# Patient Record
Sex: Female | Born: 1950 | Hispanic: No | Marital: Married | Smoking: Never smoker
Health system: Southern US, Community
[De-identification: ages and names within clinical notes are randomized; demographics above are authoritative.]

## PROBLEM LIST (undated history)

## (undated) DIAGNOSIS — M5030 Other cervical disc degeneration, unspecified cervical region: Secondary | ICD-10-CM

## (undated) DIAGNOSIS — R569 Unspecified convulsions: Secondary | ICD-10-CM

## (undated) DIAGNOSIS — E785 Hyperlipidemia, unspecified: Secondary | ICD-10-CM

## (undated) DIAGNOSIS — E119 Type 2 diabetes mellitus without complications: Secondary | ICD-10-CM

## (undated) DIAGNOSIS — Z8711 Personal history of peptic ulcer disease: Secondary | ICD-10-CM

## (undated) DIAGNOSIS — D649 Anemia, unspecified: Secondary | ICD-10-CM

## (undated) DIAGNOSIS — R5382 Chronic fatigue, unspecified: Secondary | ICD-10-CM

## (undated) DIAGNOSIS — N12 Tubulo-interstitial nephritis, not specified as acute or chronic: Secondary | ICD-10-CM

## (undated) DIAGNOSIS — R1310 Dysphagia, unspecified: Secondary | ICD-10-CM

## (undated) DIAGNOSIS — I10 Essential (primary) hypertension: Secondary | ICD-10-CM

## (undated) DIAGNOSIS — I69359 Hemiplegia and hemiparesis following cerebral infarction affecting unspecified side: Secondary | ICD-10-CM

## (undated) DIAGNOSIS — I639 Cerebral infarction, unspecified: Secondary | ICD-10-CM

## (undated) DIAGNOSIS — K2270 Barrett's esophagus without dysplasia: Secondary | ICD-10-CM

---

## 2005-05-31 ENCOUNTER — Ambulatory Visit: Payer: Self-pay | Admitting: Unknown Physician Specialty

## 2005-06-08 ENCOUNTER — Ambulatory Visit: Payer: Self-pay | Admitting: Unknown Physician Specialty

## 2005-07-08 ENCOUNTER — Ambulatory Visit: Payer: Self-pay | Admitting: Unknown Physician Specialty

## 2005-08-08 ENCOUNTER — Ambulatory Visit: Payer: Self-pay | Admitting: Unknown Physician Specialty

## 2005-10-24 ENCOUNTER — Ambulatory Visit: Payer: Self-pay | Admitting: Internal Medicine

## 2006-01-10 ENCOUNTER — Other Ambulatory Visit: Payer: Self-pay

## 2006-01-10 ENCOUNTER — Inpatient Hospital Stay: Payer: Self-pay | Admitting: Internal Medicine

## 2009-03-01 ENCOUNTER — Ambulatory Visit: Payer: Self-pay | Admitting: Adult Health

## 2010-08-02 ENCOUNTER — Ambulatory Visit: Payer: Self-pay | Admitting: "Endocrinology

## 2010-09-01 ENCOUNTER — Ambulatory Visit: Payer: Self-pay | Admitting: Adult Health

## 2010-09-12 ENCOUNTER — Ambulatory Visit: Payer: Self-pay

## 2010-10-10 ENCOUNTER — Ambulatory Visit: Payer: Self-pay | Admitting: Gynecologic Oncology

## 2010-10-18 LAB — PATHOLOGY REPORT

## 2010-11-09 ENCOUNTER — Ambulatory Visit: Payer: Self-pay | Admitting: Gynecologic Oncology

## 2011-11-09 ENCOUNTER — Ambulatory Visit: Payer: Self-pay | Admitting: "Endocrinology

## 2011-11-27 ENCOUNTER — Ambulatory Visit: Payer: Self-pay | Admitting: Gynecologic Oncology

## 2011-12-09 ENCOUNTER — Ambulatory Visit: Payer: Self-pay | Admitting: Gynecologic Oncology

## 2011-12-18 ENCOUNTER — Ambulatory Visit: Payer: Self-pay | Admitting: Gynecologic Oncology

## 2012-01-09 ENCOUNTER — Ambulatory Visit: Payer: Self-pay | Admitting: Gynecologic Oncology

## 2012-05-01 IMAGING — MG MM DIGITAL SCREENING BILAT W/ CAD
1 series · 4 of 4 positions shown · non-contrast
Comparison: none

REASON FOR EXAM: screening
COMMENTS:

PROCEDURE:     MAM - MAM [REDACTED] DIG SCREEN MAM W/CAD  - September 12, 2010 [DATE]
RESULT:     COMPARISON:  08/21/2001, 10/24/2005
TECHNIQUE: Digital screening mammograms were obtained. FDA approved
computer-aided detection (CAD) for mammography was utilized for this study.

[Series 3769: R CC · right · 4 of 4 slices shown]
[im 1/4]
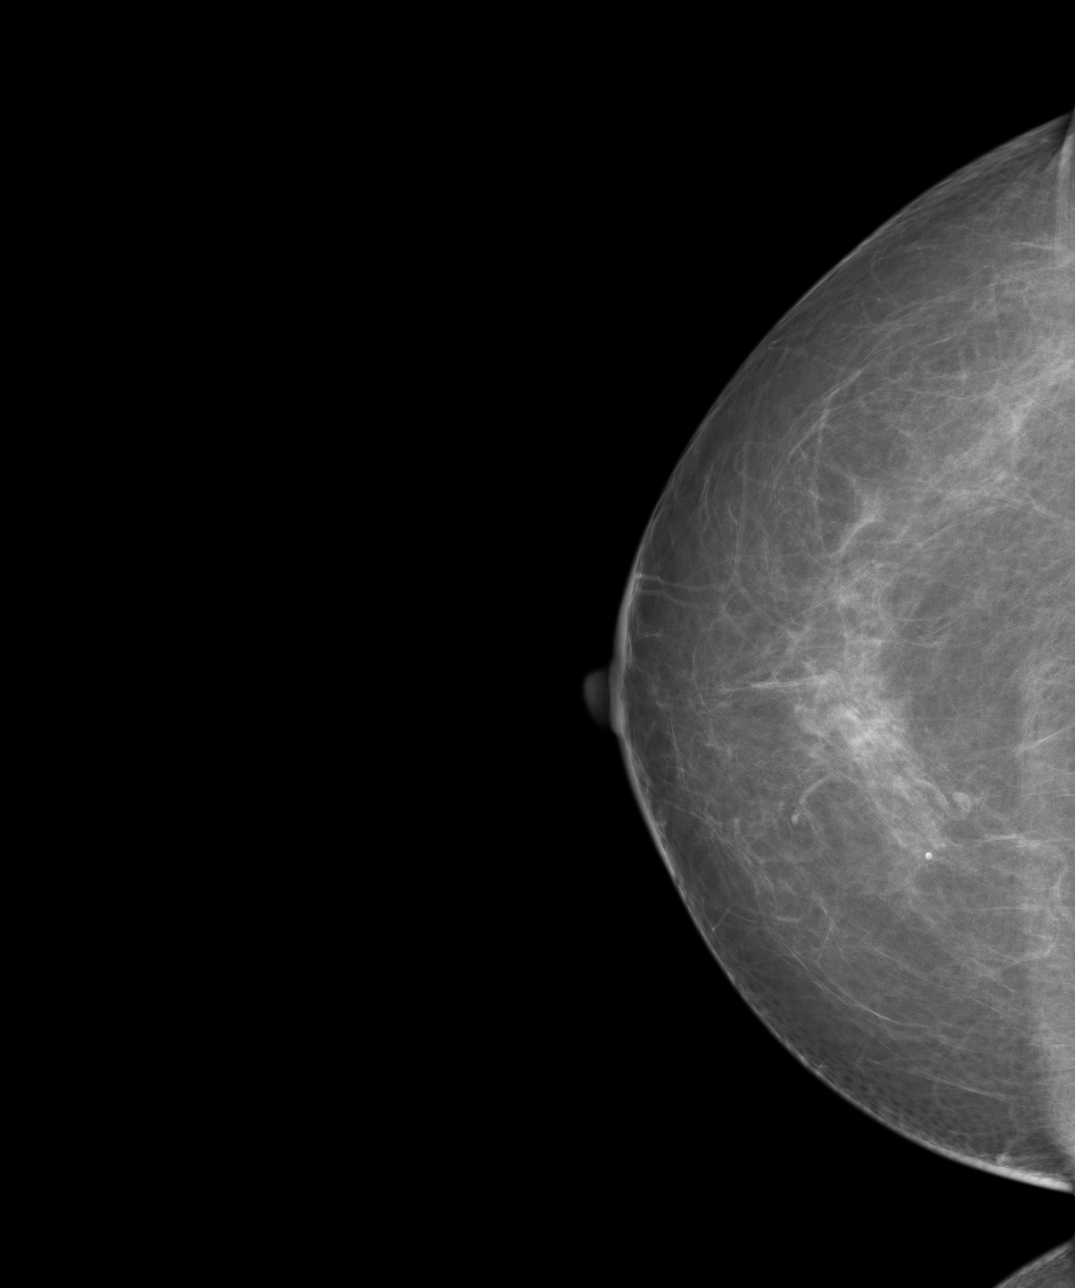
[im 2/4]
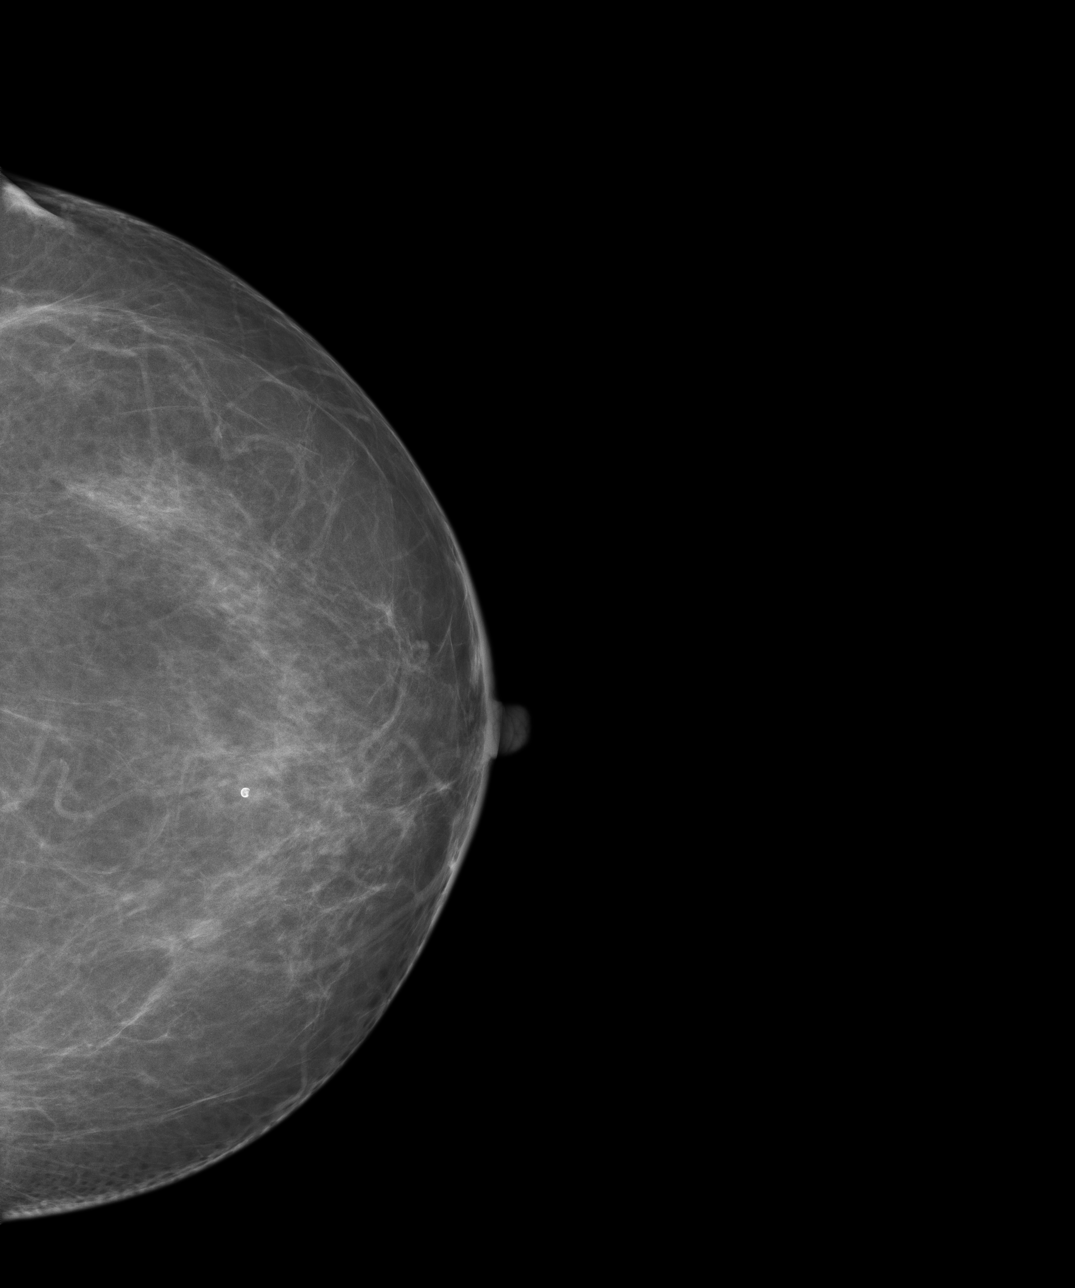
[im 3/4]
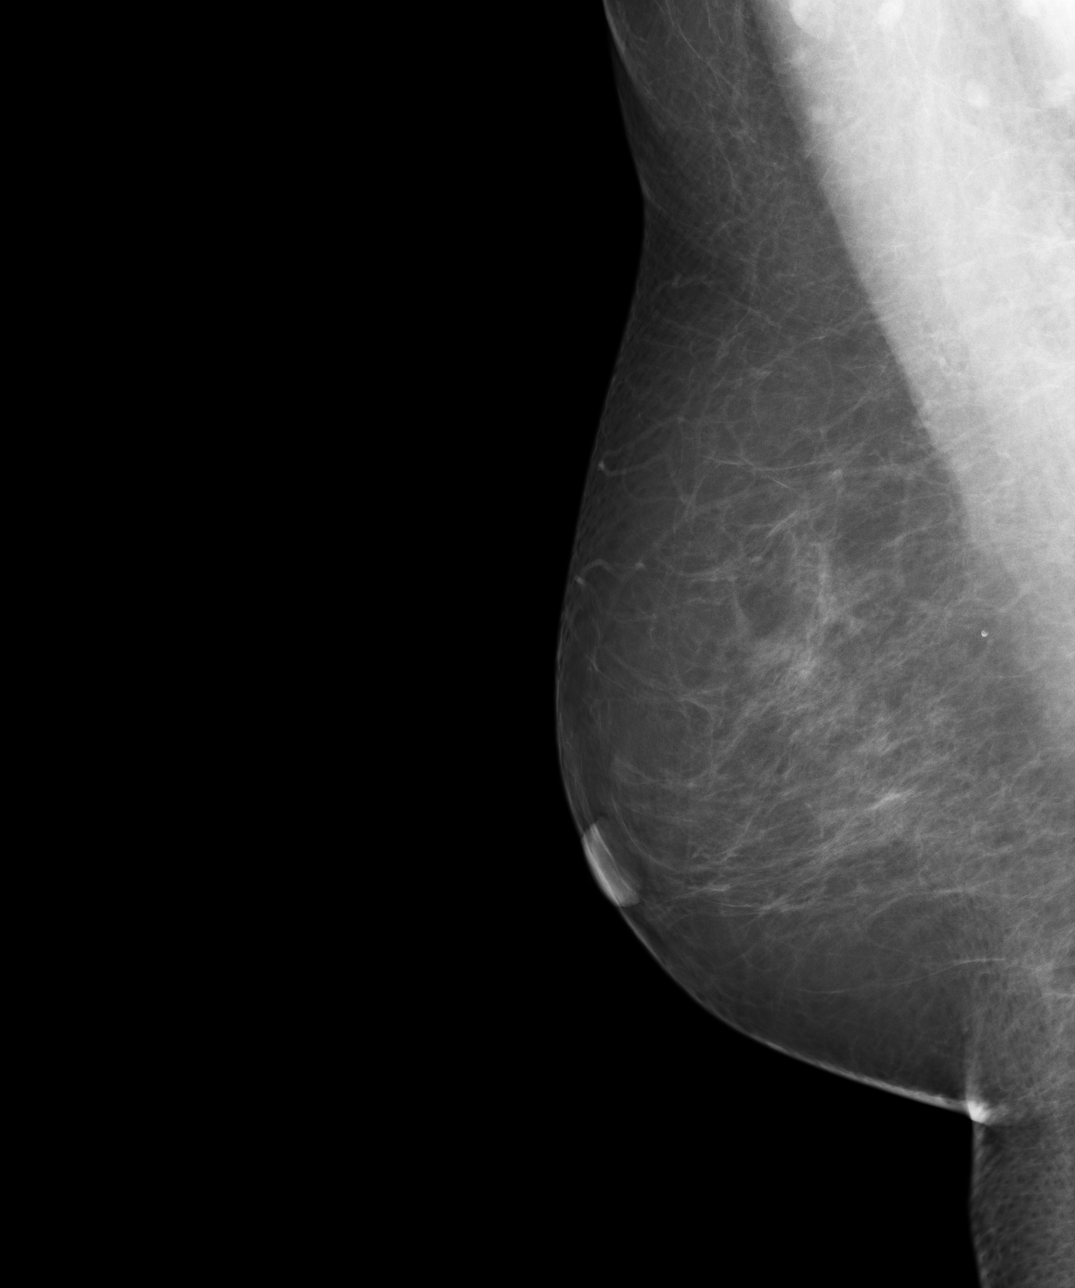
[im 4/4]
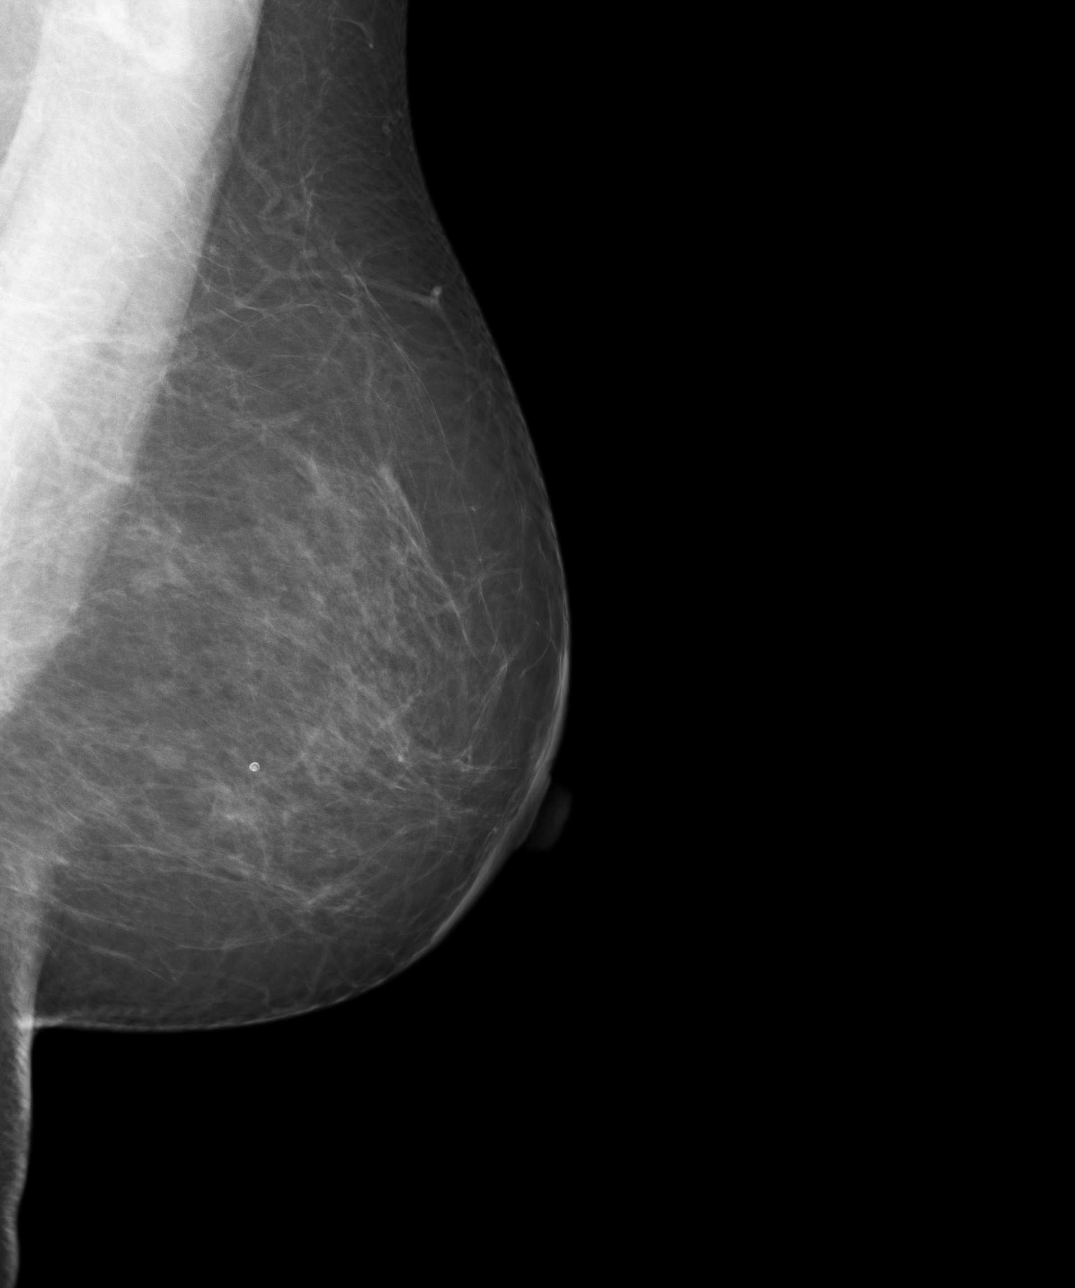

[4 of 4 positions shown; findings below may reference images not displayed]

FINDING: Bilateral breasts demonstrate a scattered fibroglandular density. There is
no dominant mass, architectural distortion or clusters of suspicious
microcalcifications.
IMPRESSION: 1.     Stable bilateral mammogram.
2.     Annual mammographic follow up recommended.
3.     BI-RADS:  Category 2- Benign.

A negative mammogram report does not preclude biopsy or other evaluation of
a clinically palpable or otherwise suspicious mass or lesion. Breast cancer
may not be detected by mammography in up to 10% of cases.

## 2012-11-12 ENCOUNTER — Ambulatory Visit: Payer: Self-pay | Admitting: Internal Medicine

## 2012-12-02 ENCOUNTER — Ambulatory Visit: Payer: Self-pay | Admitting: Gynecologic Oncology

## 2012-12-02 LAB — URINALYSIS, COMPLETE
Bacteria: NONE SEEN
Bilirubin,UR: NEGATIVE
Glucose,UR: >=500 mg/dL (ref 0–75)
Leukocyte Esterase: NEGATIVE
Nitrite: NEGATIVE
Ph: 5 (ref 4.5–8.0)
RBC,UR: 3 /HPF (ref 0–5)
Specific Gravity: 1.014 (ref 1.003–1.030)

## 2012-12-08 ENCOUNTER — Ambulatory Visit: Payer: Self-pay | Admitting: Gynecologic Oncology

## 2013-06-16 ENCOUNTER — Ambulatory Visit: Payer: Self-pay | Admitting: Gynecologic Oncology

## 2013-07-08 ENCOUNTER — Ambulatory Visit: Payer: Self-pay | Admitting: Gynecologic Oncology

## 2013-11-13 ENCOUNTER — Ambulatory Visit: Payer: Self-pay | Admitting: Internal Medicine

## 2014-03-16 ENCOUNTER — Inpatient Hospital Stay: Payer: Self-pay | Admitting: Internal Medicine

## 2014-05-09 NOTE — H&P (Signed)
PATIENT NAME:  Emily Barber, Emily Barber MR#:  [id number] DATE OF BIRTH:  09/28/1950  DATE OF ADMISSION:  03/16/2014  PRIMARY DOCTOR: Leonie Douglas. Doy Hutching, MD   EMERGENCY ROOM PHYSICIAN: Latina Craver, MD  CHIEF COMPLAINT: Elevated blood pressure.   HISTORY OF PRESENT ILLNESS: A 64 year old [place] speaking female patient brought in by family because of slurred speech started around 3:00 p.m. after she woke up from nap, associated and heaviness in both hands and legs. The patient noted to have very high blood pressure with a systolic blood pressure of 234/112. The patient brought in by family regarding the blood pressure and also symptoms concerning for stroke. The patient had no headache, no fascial droop, but noted to have slurred speech, heaviness in both hands or legs. No numbness in hands or legs. The patient lives with husband and noted to have this heaviness. According to her, this heaviness is going on for about 1 week, but it got a little more today. The patient also complains of some blurry vision today.   PAST MEDICAL HISTORY: Significant for high blood pressure and recently it has been running high, systolic in 810F as per the daughters. She also has a history of diabetes mellitus type 2, and neuropathy, and hyperlipidemia, and GERD.   ALLERGIES: BETADINE.   PAST SURGICAL HISTORY: No history of operations.  FAMILY HISTORY: The patient's sister had diabetes.   MEDICATIONS: The patient is on multiple medicines for diabetes. Her medicines include Actos 50 mg daily, Neurontin 100 mg p.o. daily, glipizide 10 mg p.o. b.i.d., Kay Ciel 10 mEq p.o. b.i.d., Lantus 60 units at bedtime, metformin 1 gram p.o. b.i.d., metoprolol succinate 50 mg p.o. daily, Protonix 40 mg daily, simvastatin 20 mg daily.   SOCIAL HISTORY: No smoking, no drinking, no drugs. Lives with husband.   REVIEW OF SYSTEMS: CONSTITUTIONAL: No fever. No fatigue.  EYES: Complains of blurred vision today. ENT: No tinnitus. No  epistaxis. No difficulty swallowing.  RESPIRATORY: No cough. No wheezing.  CARDIOVASCULAR: The patient had no chest pain, orthopnea. No PND. No palpitations.  GASTROINTESTINAL: No nausea. No vomiting. No abdominal pain.  GENITOURINARY: No dysuria or hematuria.  ENDOCRINE: No polyuria or nocturia.  HEMATOLOGIC: No anemia or easy bruising.  INTEGUMENTARY: No skin rashes. MUSCULOSKELETAL: No joint pains.  NEUROLOGIC: Complains of heaviness in both hands and legs and also some slurred speech observed today.   PSYCHIATRIC: No anxiety or insomnia.   PHYSICAL EXAMINATION:  VITAL SIGNS: Temperature is 97.9, heart rate is 80, blood pressure 234/112 initially and the patient received labetalol 10 mg IV and that brought the blood pressure down to 194/80.  GENERAL: She is a 64 year old [place] female in the ER not in distress. The patient's daughters are at bedside and translating in [place] for me.  HEENT: PERRLA. EOM intact. No scleral icterus. No conjunctivitis. Hearing is normal. Tympanic membranes clear. No pharyngeal edema. Mucous membranes normal. Dentition is good. The patient noted to have some slurred speech.  NECK: No thyroid enlargement. No JVD. No lymphadenopathy. No carotid bruit.  LUNGS: Bilaterally clear to auscultation. No wheeze. No rales.  CARDIOVASCULAR: S1, S2 regular. PMI not characterized. Good femoral and pedal pulses. No lower extremity edema.  ABDOMEN: Soft, nontender, bowel sounds present. No hepatosplenomegaly.  MUSCULOSKELETAL: Strength 5/5 in the upper and lower extremities. DTRs 2+ bilaterally. No cyanosis. No kyphosis. SKIN: No skin rashes. No erythema, no nodules. No induration. No lymphadenopathy in cervical or axillary or inguinal regions.  NEUROLOGIC: Cranial nerves II-XII intact, DTR  2+ bilaterally. Sensory intact. The patient noted to have some dysarthria.  PSYCHIATRIC: Oriented to time, place, person, cooperative.   LABORATORY DATA: Glucose 312, BUN 17,  creatinine 1.19, sodium 137, potassium 3.3. The patient's calcium 9.6.   CBC: WBC 6.7, hemoglobin 12.6, hematocrit 37.3, platelets 184,000. INR is 0.9.   The patient's CT of the head shows age-indeterminate left basal ganglia infarct.   EKG shows normal sinus rhythm, 77 beats per minute, no ST-T changes.   ASSESSMENT AND PLAN:  1.  This is a 64 year old female patient with heaviness in hands and slurred speech with malignant hypertension. Admit her to hospitalist service. Her symptoms are mostly consistent with malignant hypertension than acute stroke. she will be admitted to hospitalist service because of malignant hypertension. Continue her on home medications Diovan with hydrochlorothiazide, metoprolol. I have added hydralazine 50 mg p.o. t.i.d. in addition to her home medication list, and let us see how she does, and if needed, she can get hydralazine 20 mg IV q. 6 hours p.r.n. for systolic blood pressure more than 160/90.  2.  Heaviness in hands, legs and basal ganglia infarct. The patient's infarct is unknown whether it is acute on chronic, so I ordered MRI of the brain. Continue aspirin and also statins, and follow her neurological examination with neurological checks.  3.  Type 2 diabetes mellitus. She is on metformin, Actos and also Lantus and glipizide. Continue them and I will send her to Dr. Doy Hutching and he will see the patient tomorrow.  4.  Slurred speech and other manifestations of weakness in the hands and legs is likely secondary to malignant hypertension.   TIME SPENT: Fifty-five minutes.    ____________________________ Epifanio Lesches, MD sk:TM D: 03/16/2014 20:28:00 ET T: 03/16/2014 20:54:19 ET JOB#: [id number]  cc: Epifanio Lesches, MD, <Dictator> Epifanio Lesches MD ELECTRONICALLY SIGNED 03/26/2014 13:52

## 2014-05-09 NOTE — Discharge Summary (Signed)
PATIENT NAME:  Emily Barber, CREASMAN MR#:  [id number] DATE OF BIRTH:  1950-09-07  DATE OF ADMISSION:  03/16/2014 DATE OF DISCHARGE:  03/19/2014  REASON FOR ADMISSION: Accelerated hypertension with weakness.   HISTORY OF PRESENT ILLNESS: Please see the dictated HPI done by Dr. Vianne Bulls on 03/16/2014.   PAST MEDICAL HISTORY:  1.  Benign hypertension.  2.  Type 2 diabetes.  3.  Peripheral neuropathy.  4.  Hyperlipidemia.  5.  GE reflux disease.   MEDICATIONS ON ADMISSION: Please see admission note.   ALLERGIES: BETADINE.  SOCIAL HISTORY, FAMILY HISTORY AND REVIEW OF SYSTEMS:  As per admission note.  PHYSICAL EXAMINATION: GENERAL:  The patient was in no acute distress. VITAL SIGNS: Initially remarkable for a blood pressure of 234/112 with a heart rate of 80, temperature of 97.9.  HEENT EXAMINATION: Unremarkable.  Supple slurred speech was noted.  NECK: Supple without JVD or bruits.  LUNGS: Clear.  CARDIAC EXAMINATION: Revealed a regular rate and rhythm with normal S1, S2.  ABDOMEN: Soft and nontender.  EXTREMITIES: Were without edema.  NEUROLOGIC: Grossly nonfocal other than some dysarthria.   HOSPITAL COURSE: The patient was admitted with an acute stroke with accelerated hypertension and diabetes. She was initially placed on optimal blood pressure medications with p.r.n. IV hydralazine. She was seen by physical therapy and speech therapy. MRI of the brain did show an acute stroke. Echocardiogram and carotid Dopplers were unremarkable. Her blood pressure improved as did her blood sugars. Her dysarthria and slurred speech improved as well. She was seen by care management and clinical social work. Family deferred skilled nursing placement. They wanted to go home with home health. By 03/19/2014, the patient was stable and ready for discharge.   DISCHARGE DIAGNOSES:  1.  Acute stroke syndrome with dysarthria.  2.  Accelerated hypertension.  3.  Diabetes.  4.  Gastroesophageal reflux  disease.  5.  Hyperlipidemia.   DISCHARGE MEDICATIONS:  1.  Protonix 40 mg p.o. daily.  2.  Simvastatin 20 mg p.o. at bedtime.  3.  Toprol XL 50 mg p.o. daily.  4.  Klor-Con 20 mEq p.o. daily.  5.  Glipizide 10 mg 2 tablets b.i.d.  6.  Metformin 1000 mg p.o. b.i.d.  7.  Actos 30 mg p.o. daily.  8.  Lantus 20 units subcutaneous at bedtime.  9.  Valsartan 160 mg p.o. b.i.d.  10.  Imdur 30 mg p.o. b.i.d.  11.  Aspirin 81 mg p.o. daily.  12.  Plavix 75 mg p.o. daily.  13.  Hydralazine 50 mg p.o. q.i.d.  14.  Iron sulfate 325 mg p.o. daily.  15.  Hydrochlorothiazide 25 mg p.o. daily.   FOLLOWUP PLANS AND APPOINTMENTS:  The patient was discharged on a low sodium, carbohydrate controlled diet. She was discharged with home health. She will follow up with me within 1 weeks' time, sooner if needed.    ____________________________ Leonie Douglas Doy Hutching, MD jds:sp D: 03/25/2014 07:22:44 ET T: 03/25/2014 12:08:43 ET JOB#: [id number]  cc: Leonie Douglas. Doy Hutching, MD, <Dictator> [place] Lennice Sites MD ELECTRONICALLY SIGNED 03/25/2014 13:27

## 2014-06-18 ENCOUNTER — Other Ambulatory Visit: Payer: Self-pay | Admitting: Family Medicine

## 2015-01-10 ENCOUNTER — Other Ambulatory Visit: Payer: Self-pay | Admitting: Family Medicine

## 2015-01-25 ENCOUNTER — Other Ambulatory Visit: Payer: Self-pay | Admitting: Internal Medicine

## 2015-01-25 DIAGNOSIS — Z1231 Encounter for screening mammogram for malignant neoplasm of breast: Secondary | ICD-10-CM

## 2015-02-02 ENCOUNTER — Ambulatory Visit
Admission: RE | Admit: 2015-02-02 | Discharge: 2015-02-02 | Disposition: A | Discharge status: Home / Self Care | Payer: Medicare Other | Source: Ambulatory Visit | Attending: Internal Medicine | Admitting: Internal Medicine

## 2015-02-02 ENCOUNTER — Other Ambulatory Visit: Payer: Self-pay | Admitting: Internal Medicine

## 2015-02-02 DIAGNOSIS — Z1231 Encounter for screening mammogram for malignant neoplasm of breast: Secondary | ICD-10-CM

## 2015-06-09 ENCOUNTER — Other Ambulatory Visit: Payer: Self-pay | Admitting: Internal Medicine

## 2015-06-09 DIAGNOSIS — R1310 Dysphagia, unspecified: Secondary | ICD-10-CM

## 2015-07-01 ENCOUNTER — Ambulatory Visit
Admission: RE | Admit: 2015-07-01 | Discharge: 2015-07-01 | Disposition: A | Discharge status: Home / Self Care | Payer: Medicare Other | Source: Ambulatory Visit | Attending: Internal Medicine | Admitting: Internal Medicine

## 2015-07-01 DIAGNOSIS — R1310 Dysphagia, unspecified: Secondary | ICD-10-CM | POA: Insufficient documentation

## 2015-07-01 NOTE — Therapy (Signed)
[place], [place], [address] Phone: [phone]   Fax:     Modified Barium Swallow  Patient Details  Name: Emily Barber MRN: [id number] Date of Birth: 07/09/1950 No Data Recorded  Encounter Date: 07/01/2015      End of Session - 07/01/15 1335    Visit Number 1   Number of Visits 1   Date for SLP Re-Evaluation 07/01/15   SLP Start Time 63   SLP Stop Time  1335   SLP Time Calculation (min) 35 min   Activity Tolerance Patient tolerated treatment well      No past medical history on file.  No past surgical history on file.  There were no vitals filed for this visit.   Subjective: Patient behavior: (alertness, ability to follow instructions, etc.): Patient is [place], interpreter present for most of study- lost connection at end.  Patient stated she understood well enough.  Chief complaint: Patient has history of stroke (03/2014) and current complaints of difficulty swallowing solid food.   Objective:  Radiological Procedure: A videoflouroscopic evaluation of oral-preparatory, reflex initiation, and pharyngeal phases of the swallow was performed; as well as a screening of the upper esophageal phase.  I. POSTURE: Upright in MBS chair  II. VIEW: Lateral  III. COMPENSATORY STRATEGIES: N/A  IV. BOLUSES ADMINISTERED:   Thin Liquid: 1 large gulp   Nectar-thick Liquid: 1 large gulp    Puree: 2 teaspoon presentations   Mechanical Soft: 1/4 graham cracker in applesauce  V. RESULTS OF EVALUATION: A. ORAL PREPARATORY PHASE: (The lips, tongue, and velum are observed for strength and coordination)       **Overall Severity Rating: Within normal limits  B. SWALLOW INITIATION/REFLEX: (The reflex is normal if "triggered" by the time the bolus reached the base of the tongue)  **Overall Severity Rating: Within normal limits  C. PHARYNGEAL PHASE: (Pharyngeal function is normal if the  bolus shows rapid, smooth, and continuous transit through the pharynx and there is no pharyngeal residue after the swallow)  **Overall Severity Rating: Within normal limits  D. LARYNGEAL PENETRATION: (Material entering into the laryngeal inlet/vestibule but not aspirated)  X1 transient/flash penetration  E. ASPIRATION: None  F. ESOPHAGEAL PHASE: (Screening of the upper esophagus): limited view, but one view down to mid-esophagus showed retained barium to level of lower part of mid-esophagus  ASSESSMENT: 65 year old woman, with difficulty swallowing solid foods, is presenting with functional oropharyngeal swallowing.  Oral control of the bolus including oral hold, rotary mastication, and anterior to posterior transfer are within normal limits. Timing of the pharyngeal swallow is within normal limits.   Pharyngeal stage of swallowing including tongue base retraction, hyolaryngeal excursion, epiglottic inversion, duration/amplitude of UES opening, and laryngeal vestibule closure at the height of the swallow are within normal limits.  There was 1 episodes of transient laryngeal penetration without laryngeal vestibule residue.  There was no observed pharyngeal residue or aspiration.  The patient would benefit from referral to GI to assess esophageal function.  PLAN/RECOMMENDATIONS:   A. Diet: Regular as tolerate   B. Swallowing Precautions: N/A   C. Recommended consultation to: GI   D. Therapy recommendations N/A   E. Results and recommendations were discussed with the patient (with and without interpreter) immediately following the study and the final report routed to the referring MD.     Dysphagia - Plan: DG OP Swallowing Func-Medicare/Speech Path, DG OP Swallowing Func-Medicare/Speech Path      G-Codes -  07/01/15 1335    Functional Assessment Tool Used MBS, clinical judgment   Functional Limitations Swallowing   Swallow Current Status [id number]) At least 1 percent but less than 20 percent  impaired, limited or restricted   Swallow Goal Status [id number]) At least 1 percent but less than 20 percent impaired, limited or restricted   Swallow Discharge Status [phone]) At least 1 percent but less than 20 percent impaired, limited or restricted          Problem List There are no active problems to display for this patient.  Leroy Sea, MS/CCC- SLP  Lou Miner 07/01/2015, 1:36 PM  [facility] [place], [place], [address] Phone: [phone]   Fax:     Name: Emily Barber MRN: [id number] Date of Birth: 1950/08/07

## 2015-09-06 ENCOUNTER — Encounter: Payer: Self-pay | Admitting: *Deleted

## 2015-09-07 ENCOUNTER — Ambulatory Visit: Payer: Medicare Other | Admitting: Anesthesiology

## 2015-09-07 ENCOUNTER — Ambulatory Visit
Admission: RE | Admit: 2015-09-07 | Discharge: 2015-09-07 | Disposition: A | Discharge status: Home / Self Care | Payer: Medicare Other | Source: Ambulatory Visit | Attending: Unknown Physician Specialty | Admitting: Unknown Physician Specialty

## 2015-09-07 ENCOUNTER — Encounter
Admission: RE | Disposition: A | Discharge status: Home / Self Care | Payer: Self-pay | Source: Ambulatory Visit | Attending: Unknown Physician Specialty

## 2015-09-07 ENCOUNTER — Encounter: Payer: Self-pay | Admitting: *Deleted

## 2015-09-07 DIAGNOSIS — Z7982 Long term (current) use of aspirin: Secondary | ICD-10-CM | POA: Insufficient documentation

## 2015-09-07 DIAGNOSIS — Z79899 Other long term (current) drug therapy: Secondary | ICD-10-CM | POA: Diagnosis not present

## 2015-09-07 DIAGNOSIS — Z7902 Long term (current) use of antithrombotics/antiplatelets: Secondary | ICD-10-CM | POA: Diagnosis not present

## 2015-09-07 DIAGNOSIS — Z8673 Personal history of transient ischemic attack (TIA), and cerebral infarction without residual deficits: Secondary | ICD-10-CM | POA: Insufficient documentation

## 2015-09-07 DIAGNOSIS — R1310 Dysphagia, unspecified: Secondary | ICD-10-CM | POA: Insufficient documentation

## 2015-09-07 DIAGNOSIS — K2970 Gastritis, unspecified, without bleeding: Secondary | ICD-10-CM | POA: Insufficient documentation

## 2015-09-07 DIAGNOSIS — E119 Type 2 diabetes mellitus without complications: Secondary | ICD-10-CM | POA: Diagnosis not present

## 2015-09-07 DIAGNOSIS — Z7984 Long term (current) use of oral hypoglycemic drugs: Secondary | ICD-10-CM | POA: Diagnosis not present

## 2015-09-07 DIAGNOSIS — I10 Essential (primary) hypertension: Secondary | ICD-10-CM | POA: Diagnosis not present

## 2015-09-07 DIAGNOSIS — K319 Disease of stomach and duodenum, unspecified: Secondary | ICD-10-CM | POA: Insufficient documentation

## 2015-09-07 DIAGNOSIS — K210 Gastro-esophageal reflux disease with esophagitis: Secondary | ICD-10-CM | POA: Diagnosis not present

## 2015-09-07 DIAGNOSIS — E785 Hyperlipidemia, unspecified: Secondary | ICD-10-CM | POA: Insufficient documentation

## 2015-09-07 HISTORY — DX: Anemia, unspecified: D64.9

## 2015-09-07 HISTORY — DX: Unspecified convulsions: R56.9

## 2015-09-07 HISTORY — PX: ESOPHAGOGASTRODUODENOSCOPY (EGD) WITH PROPOFOL: SHX5813

## 2015-09-07 HISTORY — DX: Hyperlipidemia, unspecified: E78.5

## 2015-09-07 HISTORY — DX: Other cervical disc degeneration, unspecified cervical region: M50.30

## 2015-09-07 HISTORY — DX: Chronic fatigue, unspecified: R53.82

## 2015-09-07 HISTORY — DX: Essential (primary) hypertension: I10

## 2015-09-07 HISTORY — DX: Type 2 diabetes mellitus without complications: E11.9

## 2015-09-07 HISTORY — DX: Tubulo-interstitial nephritis, not specified as acute or chronic: N12

## 2015-09-07 HISTORY — DX: Dysphagia, unspecified: R13.10

## 2015-09-07 HISTORY — DX: Cerebral infarction, unspecified: I63.9

## 2015-09-07 LAB — GLUCOSE, CAPILLARY: GLUCOSE-CAPILLARY: 86 mg/dL (ref 65–99)

## 2015-09-07 SURGERY — ESOPHAGOGASTRODUODENOSCOPY (EGD) WITH PROPOFOL
Anesthesia: General

## 2015-09-07 MED ORDER — PROPOFOL 10 MG/ML IV BOLUS
INTRAVENOUS | Status: DC | PRN
  Administered 2015-09-07: 20 mg via INTRAVENOUS

## 2015-09-07 MED ORDER — SODIUM CHLORIDE 0.9 % IV SOLN
INTRAVENOUS | Status: DC
  Administered 2015-09-07: 08:00:00 via INTRAVENOUS

## 2015-09-07 MED ORDER — GLYCOPYRROLATE 0.2 MG/ML IJ SOLN
INTRAMUSCULAR | Status: DC | PRN
  Administered 2015-09-07: 0.2 mg via INTRAVENOUS

## 2015-09-07 MED ORDER — PROPOFOL 500 MG/50ML IV EMUL
INTRAVENOUS | Status: DC | PRN
  Administered 2015-09-07: 120 ug/kg/min via INTRAVENOUS

## 2015-09-07 MED ORDER — MIDAZOLAM HCL 2 MG/2ML IJ SOLN
INTRAMUSCULAR | Status: DC | PRN
Start: 2015-09-07 — End: 2015-09-07
  Administered 2015-09-07: 1 mg via INTRAVENOUS

## 2015-09-07 MED ORDER — LIDOCAINE HCL (CARDIAC) 20 MG/ML IV SOLN
INTRAVENOUS | Status: DC | PRN
  Administered 2015-09-07: 30 mg via INTRAVENOUS

## 2015-09-07 MED ORDER — SODIUM CHLORIDE 0.9 % IV SOLN: INTRAVENOUS | Status: DC

## 2015-09-07 NOTE — Anesthesia Postprocedure Evaluation (Signed)
Anesthesia Post Note  Patient: Emily Barber  Procedure(s) Performed: Procedure(s) (LRB): ESOPHAGOGASTRODUODENOSCOPY (EGD) WITH PROPOFOL (N/A)  Patient location during evaluation: Endoscopy Anesthesia Type: General Level of consciousness: awake and alert Pain management: pain level controlled Vital Signs Assessment: post-procedure vital signs reviewed and stable Respiratory status: spontaneous breathing and respiratory function stable Cardiovascular status: stable Anesthetic complications: no    Last Vitals:  Vitals:   09/07/15 0848 09/07/15 0858  BP: 126/71 (!) 144/76  Pulse: 83 85  Resp: (!) 21 (!) 23  Temp: 36.3 C     Last Pain:  Vitals:   09/07/15 0848  TempSrc: Tympanic                 [name]

## 2015-09-07 NOTE — Op Note (Signed)
[facility] Patient Name: Emily Barber Procedure Date: 09/07/2015 8:28 AM MRN: [id number] Account #: [id number] Date of Birth: [birth year] Admit Type: Outpatient Age: 65 Room: [facility] ENDO ROOM 4 Gender: Female Note Status: Finalized Procedure:            Upper GI endoscopy Indications:          Dysphagia, Heartburn Providers:            Manya Silvas, MD Referring MD:         Leonie Douglas. Doy Hutching, MD (Referring MD) Medicines:            Propofol per Anesthesia Complications:        No immediate complications. Procedure:            Pre-Anesthesia Assessment:                       - After reviewing the risks and benefits, the patient                        was deemed in satisfactory condition to undergo the                        procedure.                       After obtaining informed consent, the endoscope was                        passed under direct vision. Throughout the procedure,                        the patient's blood pressure, pulse, and oxygen                        saturations were monitored continuously. The                        Colonoscope was introduced through the mouth, and                        advanced to the second part of duodenum. The upper GI                        endoscopy was accomplished without difficulty. The                        patient tolerated the procedure well. Findings:      LA Grade A (one or more mucosal breaks less than 5 mm, not extending       between tops of 2 mucosal folds) esophagitis with no bleeding was found       39 cm from the incisors. Biopsies were taken with a cold forceps for       histology.      Patchy mild inflammation characterized by erythema and granularity was       found in the gastric body and in the gastric antrum. Biopsies were taken       with a cold forceps for histology. Biopsies were taken with a cold       forceps for Helicobacter pylori testing.      The examined  duodenum was normal. Impression:           -  LA Grade A reflux esophagitis. Rule out Barrett's                        esophagus. Biopsied.                       - Gastritis. Biopsied.                       - Normal examined duodenum. Recommendation:       - Await pathology results. Stay on medicine for your                        stomach forever. Manya Silvas, MD 09/07/2015 8:47:00 AM This report has been signed electronically. Number of Addenda: 0 Note Initiated On: 09/07/2015 8:28 AM      [facility]

## 2015-09-07 NOTE — H&P (Signed)
Primary Care Physician:  Idelle Crouch, MD Primary Gastroenterologist:  Dr. Vira Agar  Pre-Procedure History & Physical: HPI:  Emily Barber is a 65 y.o. female is here for an endoscopy.  For dysphagia and GERD   Past Medical History:  Diagnosis Date  . Anemia   . Chronic fatigue   . DDD (degenerative disc disease), cervical   . Diabetes mellitus without complication ([place])   . Dysphagia   . Hyperlipidemia   . Hypertension   . Pyelonephritis   . Seizures ([place])   . Stroke [facility])     Past Surgical History:  Procedure Laterality Date  . CESAREAN SECTION      Prior to Admission medications   Medication Sig Start Date End Date Taking? Authorizing Provider  aspirin EC 81 MG tablet Take 81 mg by mouth daily.   Yes Historical Provider, MD  clopidogrel (PLAVIX) 75 MG tablet Take 75 mg by mouth daily.   Yes Historical Provider, MD  ferrous sulfate 325 (65 FE) MG tablet Take 325 mg by mouth daily with breakfast.   Yes Historical Provider, MD  glipiZIDE (GLUCOTROL) 10 MG tablet Take 10 mg by mouth daily before breakfast.   Yes Historical Provider, MD  hydrochlorothiazide (HYDRODIURIL) 25 MG tablet Take 25 mg by mouth daily.   Yes Historical Provider, MD  Insulin Degludec (TRESIBA FLEXTOUCH) 200 UNIT/ML SOPN Inject 50 Units into the skin daily.   Yes Historical Provider, MD  isosorbide mononitrate (IMDUR) 30 MG 24 hr tablet Take 30 mg by mouth 2 (two) times daily.   Yes Historical Provider, MD  metFORMIN (GLUCOPHAGE) 1000 MG tablet Take 1,000 mg by mouth 2 (two) times daily with a meal.   Yes Historical Provider, MD  metoprolol succinate (TOPROL-XL) 100 MG 24 hr tablet Take 100 mg by mouth daily. Take with or immediately following a meal.   Yes Historical Provider, MD  MULTIPLE VITAMIN PO Take 1 tablet by mouth daily.   Yes Historical Provider, MD  pioglitazone (ACTOS) 15 MG tablet Take 15 mg by mouth daily.   Yes Historical Provider, MD  potassium chloride SA (K-DUR,KLOR-CON) 20  MEQ tablet Take 20 mEq by mouth daily.   Yes Historical Provider, MD  simvastatin (ZOCOR) 20 MG tablet Take 20 mg by mouth daily.   Yes Historical Provider, MD  valsartan (DIOVAN) 160 MG tablet Take 160 mg by mouth daily.   Yes Historical Provider, MD  ESTRACE VAGINAL 0.1 MG/GM vaginal cream APPLY ONE APPLICATORFUL VAGINALLY THREE TIMES A WEEK Patient not taking: Reported on 09/07/2015 01/11/15   Evlyn Kanner, NP    Allergies as of 09/05/2015  . (Not on File)    History reviewed. No pertinent family history.  Social History   Social History  . Marital status: Married    Spouse name: N/A  . Number of children: N/A  . Years of education: N/A   Occupational History  . Not on file.   Social History Main Topics  . Smoking status: Never Smoker  . Smokeless tobacco: Never Used  . Alcohol use No  . Drug use: No  . Sexual activity: Not on file   Other Topics Concern  . Not on file   Social History Narrative  . No narrative on file    Review of Systems: See HPI, otherwise negative ROS  Physical Exam: BP (!) 141/76   Pulse 87   Temp 97.3 F (36.3 C) (Tympanic)   Resp 15   Ht 5\' 4"  (1.626 m)  Wt 71.2 kg (157 lb)   SpO2 98%   BMI 26.95 kg/m  General:   Alert,  pleasant and cooperative in NAD Head:  Normocephalic and atraumatic. Neck:  Supple; no masses or thyromegaly. Lungs:  Clear throughout to auscultation.    Heart:  Regular rate and rhythm. Abdomen:  Soft, nontender and nondistended. Normal bowel sounds, without guarding, and without rebound.   Neurologic:  Alert and  oriented x4;  grossly normal neurologically.  Impression/Plan: Elizabella S Virrueta is here for an endoscopy to be performed for dysphagia and GERD  Risks, benefits, limitations, and alternatives regarding  endoscopy have been reviewed with the patient.  Questions have been answered.  All parties agreeable.   Gaylyn Cheers, MD  09/07/2015, 10:32 AM

## 2015-09-07 NOTE — Anesthesia Preprocedure Evaluation (Signed)
Anesthesia Evaluation  Patient identified by MRN, date of birth, ID band Patient awake    Reviewed: Allergy & Precautions, NPO status , Patient's Chart, lab work & pertinent test results  History of Anesthesia Complications Negative for: history of anesthetic complications  Airway Mallampati: II       Dental   Pulmonary neg pulmonary ROS,           Cardiovascular hypertension, Pt. on medications and Pt. on home beta blockers      Neuro/Psych Seizures - (only with hypoglycemia),  CVA (l sided weakness), Residual Symptoms    GI/Hepatic GERD  ,  Endo/Other  diabetes, Type 2, Oral Hypoglycemic Agents  Renal/GU Renal disease (Pyelonephritis)     Musculoskeletal   Abdominal   Peds  Hematology  (+) anemia ,   Anesthesia Other Findings   Reproductive/Obstetrics                            Anesthesia Physical Anesthesia Plan  ASA: III  Anesthesia Plan: General   Post-op Pain Management:    Induction: Intravenous  Airway Management Planned: Nasal Cannula  Additional Equipment:   Intra-op Plan:   Post-operative Plan:   Informed Consent: I have reviewed the patients History and Physical, chart, labs and discussed the procedure including the risks, benefits and alternatives for the proposed anesthesia with the patient or authorized representative who has indicated his/her understanding and acceptance.     Plan Discussed with:   Anesthesia Plan Comments:         Anesthesia Quick Evaluation

## 2015-09-07 NOTE — Transfer of Care (Signed)
Immediate Anesthesia Transfer of Care Note  Patient: Emily Barber  Procedure(s) Performed: Procedure(s): ESOPHAGOGASTRODUODENOSCOPY (EGD) WITH PROPOFOL (N/A)  Patient Location: PACU  Anesthesia Type:General  Level of Consciousness: sedated  Airway & Oxygen Therapy: Patient Spontanous Breathing and Patient connected to nasal cannula oxygen  Post-op Assessment: Report given to RN and Post -op Vital signs reviewed and stable  Post vital signs: Reviewed and stable  Last Vitals:  Vitals:   09/07/15 0733 09/07/15 0848  BP: (!) 155/65 126/71  Pulse: 69 83  Resp: 20 (!) 21  Temp: 36.6 C 36.3 C    Last Pain:  Vitals:   09/07/15 0848  TempSrc: Tympanic         Complications: No apparent anesthesia complications

## 2015-09-07 NOTE — Anesthesia Procedure Notes (Signed)
Date/Time: 09/07/2015 8:28 AM Performed by: Johnna Acosta Pre-anesthesia Checklist: Patient identified, Emergency Drugs available, Suction available, Patient being monitored and Timeout performed Patient Re-evaluated:Patient Re-evaluated prior to inductionOxygen Delivery Method: Nasal cannula

## 2015-09-08 ENCOUNTER — Encounter: Payer: Self-pay | Admitting: Unknown Physician Specialty

## 2015-09-09 LAB — SURGICAL PATHOLOGY

## 2015-10-05 ENCOUNTER — Ambulatory Visit: Payer: Medicare Other | Admitting: *Deleted

## 2015-10-05 ENCOUNTER — Encounter: Payer: Self-pay | Admitting: Anesthesiology

## 2015-10-05 ENCOUNTER — Ambulatory Visit
Admission: RE | Admit: 2015-10-05 | Discharge: 2015-10-05 | Disposition: A | Discharge status: Home / Self Care | Payer: Medicare Other | Source: Ambulatory Visit | Attending: Unknown Physician Specialty | Admitting: Unknown Physician Specialty

## 2015-10-05 ENCOUNTER — Encounter
Admission: RE | Disposition: A | Discharge status: Home / Self Care | Payer: Self-pay | Source: Ambulatory Visit | Attending: Unknown Physician Specialty

## 2015-10-05 DIAGNOSIS — M1990 Unspecified osteoarthritis, unspecified site: Secondary | ICD-10-CM | POA: Insufficient documentation

## 2015-10-05 DIAGNOSIS — E785 Hyperlipidemia, unspecified: Secondary | ICD-10-CM | POA: Diagnosis not present

## 2015-10-05 DIAGNOSIS — Z79899 Other long term (current) drug therapy: Secondary | ICD-10-CM | POA: Insufficient documentation

## 2015-10-05 DIAGNOSIS — K219 Gastro-esophageal reflux disease without esophagitis: Secondary | ICD-10-CM | POA: Diagnosis not present

## 2015-10-05 DIAGNOSIS — Z7982 Long term (current) use of aspirin: Secondary | ICD-10-CM | POA: Insufficient documentation

## 2015-10-05 DIAGNOSIS — K640 First degree hemorrhoids: Secondary | ICD-10-CM | POA: Diagnosis not present

## 2015-10-05 DIAGNOSIS — D122 Benign neoplasm of ascending colon: Secondary | ICD-10-CM | POA: Insufficient documentation

## 2015-10-05 DIAGNOSIS — D649 Anemia, unspecified: Secondary | ICD-10-CM | POA: Diagnosis not present

## 2015-10-05 DIAGNOSIS — Z8673 Personal history of transient ischemic attack (TIA), and cerebral infarction without residual deficits: Secondary | ICD-10-CM | POA: Diagnosis not present

## 2015-10-05 DIAGNOSIS — I10 Essential (primary) hypertension: Secondary | ICD-10-CM | POA: Diagnosis not present

## 2015-10-05 DIAGNOSIS — R195 Other fecal abnormalities: Secondary | ICD-10-CM | POA: Diagnosis not present

## 2015-10-05 DIAGNOSIS — E119 Type 2 diabetes mellitus without complications: Secondary | ICD-10-CM | POA: Insufficient documentation

## 2015-10-05 DIAGNOSIS — Z7984 Long term (current) use of oral hypoglycemic drugs: Secondary | ICD-10-CM | POA: Insufficient documentation

## 2015-10-05 DIAGNOSIS — D123 Benign neoplasm of transverse colon: Secondary | ICD-10-CM | POA: Insufficient documentation

## 2015-10-05 HISTORY — PX: COLONOSCOPY: SHX5424

## 2015-10-05 LAB — GLUCOSE, CAPILLARY: Glucose-Capillary: 241 mg/dL — ABNORMAL HIGH (ref 65–99)

## 2015-10-05 SURGERY — COLONOSCOPY
Anesthesia: General

## 2015-10-05 MED ORDER — PROPOFOL 500 MG/50ML IV EMUL
INTRAVENOUS | Status: DC | PRN
  Administered 2015-10-05: 50 ug/kg/min via INTRAVENOUS

## 2015-10-05 MED ORDER — SODIUM CHLORIDE 0.9 % IV SOLN
INTRAVENOUS | Status: DC
  Administered 2015-10-05: 07:00:00 via INTRAVENOUS

## 2015-10-05 MED ORDER — SODIUM CHLORIDE 0.9 % IV SOLN
INTRAVENOUS | Status: DC
  Administered 2015-10-05: 08:00:00 via INTRAVENOUS

## 2015-10-05 NOTE — Op Note (Signed)
[facility] Patient Name: Emily Barber Procedure Date: 10/05/2015 7:28 AM MRN: [id number] Account #: [id number] Date of Birth: 20-Aug-1950 Admit Type: Outpatient Age: 65 Room: [facility] ENDO ROOM 3 Gender: Female Note Status: Finalized Procedure:            Colonoscopy Indications:          Positive Cologuard test Providers:            Manya Silvas, MD Referring MD:         Leonie Douglas. Doy Hutching, MD (Referring MD) Medicines:            Propofol per Anesthesia Complications:        No immediate complications. Procedure:            Pre-Anesthesia Assessment:                       - After reviewing the risks and benefits, the patient                        was deemed in satisfactory condition to undergo the                        procedure.                       After obtaining informed consent, the colonoscope was                        passed under direct vision. Throughout the procedure,                        the patient's blood pressure, pulse, and oxygen                        saturations were monitored continuously. The                        Colonoscope was introduced through the anus and                        advanced to the the cecum, identified by appendiceal                        orifice and ileocecal valve. The colonoscopy was                        performed without difficulty. The patient tolerated the                        procedure well. The quality of the bowel preparation                        was excellent. Findings:      A small polyp was found in the ascending colon. The polyp was sessile.       The polyp was removed with a hot snare. Resection and retrieval were       complete. To prevent bleeding after the polypectomy, one hemostatic clip       was successfully placed. There was no bleeding at the end of the       procedure.      A  small polyp was found in the transverse colon. The polyp was sessile.       The polyp  was removed with a hot snare. Resection and retrieval were       complete.      A small polyp was found in the transverse colon. The polyp was sessile.       The polyp was removed with a hot snare. Resection and retrieval were       complete. To prevent bleeding after the polypectomy, one hemostatic clip       was successfully placed. There was no bleeding during, or at the end, of       the procedure.      . A tiny polyp was destroyed with tip of snare with heat.      Internal hemorrhoids were found during endoscopy. The hemorrhoids were       small and Grade I (internal hemorrhoids that do not prolapse). Impression:           - One small polyp in the ascending colon, removed with                        a hot snare. Resected and retrieved. Clip was placed.                       - One small polyp in the transverse colon, removed with                        a hot snare. Resected and retrieved.                       - One small polyp in the transverse colon, removed with                        a hot snare. Resected and retrieved. Clip was placed.                       - One diminutive polyp in the transverse colon.                       - Internal hemorrhoids. Recommendation:       - Await pathology results. Restart Plavix tomorrow. Manya Silvas, MD 10/05/2015 8:05:29 AM This report has been signed electronically. Number of Addenda: 0 Note Initiated On: 10/05/2015 7:28 AM Scope Withdrawal Time: 0 hours 18 minutes 39 seconds  Total Procedure Duration: 0 hours 25 minutes 20 seconds       [facility]

## 2015-10-05 NOTE — H&P (Signed)
Primary Care Physician:  Idelle Crouch, MD Primary Gastroenterologist:  Dr. Vira Agar  Pre-Procedure History & Physical: HPI:  Emily Barber is a 65 y.o. female is here for an colonoscopy.   Past Medical History:  Diagnosis Date  . Anemia   . Chronic fatigue   . DDD (degenerative disc disease), cervical   . Diabetes mellitus without complication ([place])   . Dysphagia   . Hyperlipidemia   . Hypertension   . Pyelonephritis   . Seizures ([place])   . Stroke [facility])     Past Surgical History:  Procedure Laterality Date  . CESAREAN SECTION    . ESOPHAGOGASTRODUODENOSCOPY (EGD) WITH PROPOFOL N/A 09/07/2015   Procedure: ESOPHAGOGASTRODUODENOSCOPY (EGD) WITH PROPOFOL;  Surgeon: Manya Silvas, MD;  Location: [facility] ENDOSCOPY;  Service: Endoscopy;  Laterality: N/A;    Prior to Admission medications   Medication Sig Start Date End Date Taking? Authorizing Provider  aspirin EC 81 MG tablet Take 81 mg by mouth daily.   Yes Historical Provider, MD  clopidogrel (PLAVIX) 75 MG tablet Take 75 mg by mouth daily.   Yes Historical Provider, MD  ESTRACE VAGINAL 0.1 MG/GM vaginal cream APPLY ONE APPLICATORFUL VAGINALLY THREE TIMES A WEEK 01/11/15  Yes Evlyn Kanner, NP  ferrous sulfate 325 (65 FE) MG tablet Take 325 mg by mouth daily with breakfast.   Yes Historical Provider, MD  glipiZIDE (GLUCOTROL) 10 MG tablet Take 10 mg by mouth daily before breakfast.   Yes Historical Provider, MD  hydrochlorothiazide (HYDRODIURIL) 25 MG tablet Take 25 mg by mouth daily.   Yes Historical Provider, MD  Insulin Degludec (TRESIBA FLEXTOUCH) 200 UNIT/ML SOPN Inject 50 Units into the skin daily.   Yes Historical Provider, MD  isosorbide mononitrate (IMDUR) 30 MG 24 hr tablet Take 30 mg by mouth 2 (two) times daily.   Yes Historical Provider, MD  metFORMIN (GLUCOPHAGE) 1000 MG tablet Take 1,000 mg by mouth 2 (two) times daily with a meal.   Yes Historical Provider, MD  metoprolol succinate (TOPROL-XL) 100 MG 24  hr tablet Take 100 mg by mouth daily. Take with or immediately following a meal.   Yes Historical Provider, MD  MULTIPLE VITAMIN PO Take 1 tablet by mouth daily.   Yes Historical Provider, MD  pioglitazone (ACTOS) 15 MG tablet Take 15 mg by mouth daily.   Yes Historical Provider, MD  potassium chloride SA (K-DUR,KLOR-CON) 20 MEQ tablet Take 20 mEq by mouth daily.   Yes Historical Provider, MD  simvastatin (ZOCOR) 20 MG tablet Take 20 mg by mouth daily.   Yes Historical Provider, MD  valsartan (DIOVAN) 160 MG tablet Take 160 mg by mouth daily.   Yes Historical Provider, MD    Allergies as of 09/29/2015 - Review Complete 09/07/2015  Allergen Reaction Noted  . Povidone iodine  09/06/2015    No family history on file.  Social History   Social History  . Marital status: Married    Spouse name: N/A  . Number of children: N/A  . Years of education: N/A   Occupational History  . Not on file.   Social History Main Topics  . Smoking status: Never Smoker  . Smokeless tobacco: Never Used  . Alcohol use No  . Drug use: No  . Sexual activity: Not on file   Other Topics Concern  . Not on file   Social History Narrative  . No narrative on file    Review of Systems: See HPI, otherwise negative ROS  Physical  Exam: BP (!) 163/80   Pulse 85   Temp 98.1 F (36.7 C) (Tympanic)   Resp 16   Ht 5\' 5"  (1.651 m)   Wt 76.2 kg (168 lb)   SpO2 96%   BMI 27.96 kg/m  General:   Alert,  pleasant and cooperative in NAD Head:  Normocephalic and atraumatic. Neck:  Supple; no masses or thyromegaly. Lungs:  Clear throughout to auscultation.    Heart:  Regular rate and rhythm. Abdomen:  Soft, nontender and nondistended. Normal bowel sounds, without guarding, and without rebound.   Neurologic:  Alert and  oriented x4;  grossly normal neurologically.  Impression/Plan: Emily Barber is here for an colonoscopy to be performed for exam due to a positive Cologuard test on her  stool  Risks, benefits, limitations, and alternatives regarding  colonoscopy have been reviewed with the patient.  Questions have been answered.  All parties agreeable.   Gaylyn Cheers, MD  10/05/2015, 7:21 AM

## 2015-10-05 NOTE — Anesthesia Preprocedure Evaluation (Signed)
Anesthesia Evaluation  Patient identified by MRN, date of birth, ID band Patient awake    Reviewed: Allergy & Precautions, NPO status , Patient's Chart, lab work & pertinent test results  History of Anesthesia Complications Negative for: history of anesthetic complications  Airway Mallampati: II       Dental   Pulmonary neg pulmonary ROS,           Cardiovascular hypertension, Pt. on medications and Pt. on home beta blockers      Neuro/Psych Seizures - (only with hypoglycemia),  CVA (l sided weakness), Residual Symptoms    GI/Hepatic GERD  Medicated,  Endo/Other  diabetes, Type 2, Oral Hypoglycemic Agents  Renal/GU Renal disease (Pyelonephritis)  negative genitourinary   Musculoskeletal  (+) Arthritis , Osteoarthritis,    Abdominal   Peds  Hematology  (+) anemia ,   Anesthesia Other Findings   Reproductive/Obstetrics                             Anesthesia Physical  Anesthesia Plan  ASA: III  Anesthesia Plan: General   Post-op Pain Management:    Induction: Intravenous  Airway Management Planned: Nasal Cannula  Additional Equipment:   Intra-op Plan:   Post-operative Plan:   Informed Consent: I have reviewed the patients History and Physical, chart, labs and discussed the procedure including the risks, benefits and alternatives for the proposed anesthesia with the patient or authorized representative who has indicated his/her understanding and acceptance.     Plan Discussed with:   Anesthesia Plan Comments:         Anesthesia Quick Evaluation

## 2015-10-05 NOTE — Anesthesia Postprocedure Evaluation (Signed)
Anesthesia Post Note  Patient: Emily Barber  Procedure(s) Performed: Procedure(s) (LRB): COLONOSCOPY (N/A)  Patient location during evaluation: PACU Anesthesia Type: General Level of consciousness: awake and alert and oriented Pain management: pain level controlled Vital Signs Assessment: post-procedure vital signs reviewed and stable Respiratory status: spontaneous breathing Cardiovascular status: blood pressure returned to baseline Anesthetic complications: no    Last Vitals:  Vitals:   10/05/15 0826 10/05/15 0839  BP: (!) 178/77 (!) 179/70  Pulse:    Resp:    Temp:      Last Pain:  Vitals:   10/05/15 0806  TempSrc: Tympanic                 Kyarah Enamorado

## 2015-10-05 NOTE — Transfer of Care (Signed)
Immediate Anesthesia Transfer of Care Note  Patient: Emily Barber  Procedure(s) Performed: Procedure(s): COLONOSCOPY (N/A)  Patient Location: PACU  Anesthesia Type:General  Level of Consciousness: awake, alert  and oriented  Airway & Oxygen Therapy: Patient connected to nasal cannula oxygen  Post-op Assessment: Report given to RN and Post -op Vital signs reviewed and stable  Post vital signs: Reviewed and stable  Last Vitals:  Vitals:   10/05/15 0710  BP: (!) 163/80  Pulse: 85  Resp: 16  Temp: 36.7 C    Last Pain:  Vitals:   10/05/15 0710  TempSrc: Tympanic         Complications: No apparent anesthesia complications

## 2015-10-06 ENCOUNTER — Encounter: Payer: Self-pay | Admitting: Unknown Physician Specialty

## 2015-10-06 LAB — SURGICAL PATHOLOGY

## 2015-11-03 IMAGING — CT CT HEAD WITHOUT CONTRAST
1 series · 15 of 30 positions shown, 19 images · non-contrast
Comparison: None.

CLINICAL DATA: Slurred speech, left-sided weakness since 3 p.m.
today.

EXAM:
CT HEAD WITHOUT CONTRAST
TECHNIQUE: Contiguous axial images were obtained from the base of the skull
through the vertex without intravenous contrast.

[Series 2: head wo · axial · 0.38mm/px · z∈[+10,+136]mm · 15 of 32 slices shown, 19 images]
[im 2/32  brain]
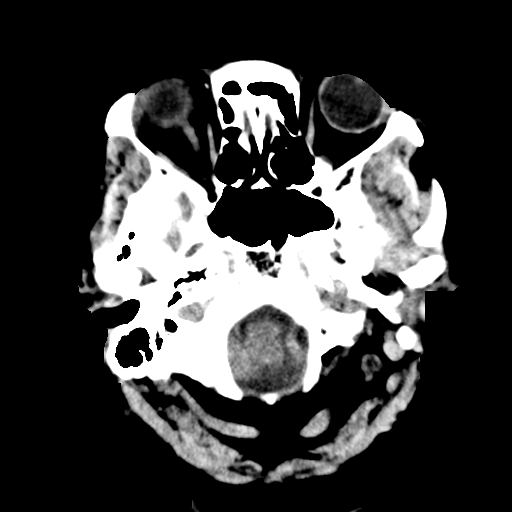
[im 2/32  bone]
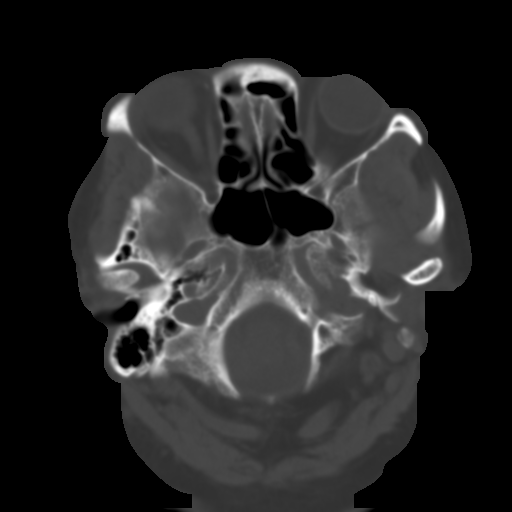
[im 4/32  brain]
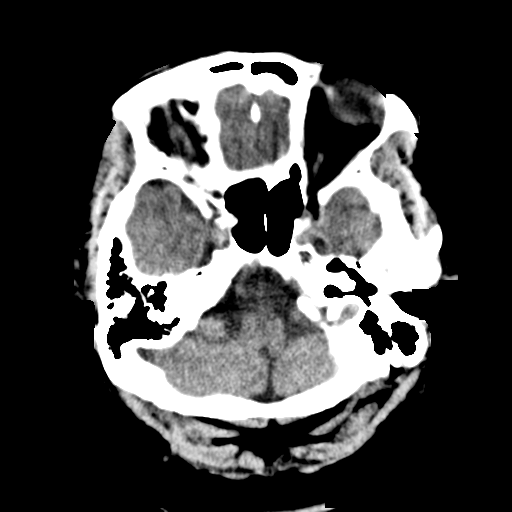
[im 6/32  brain]
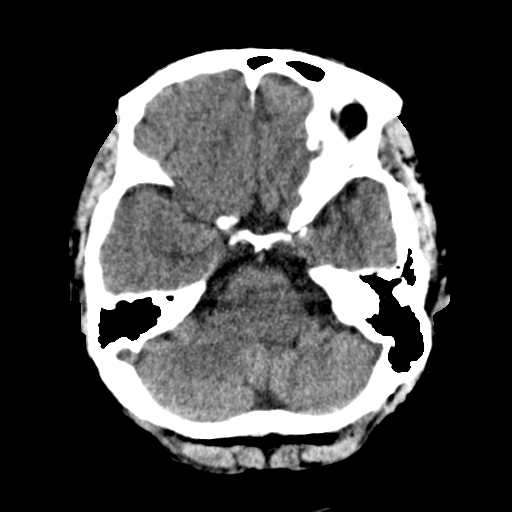
[im 8/32  brain]
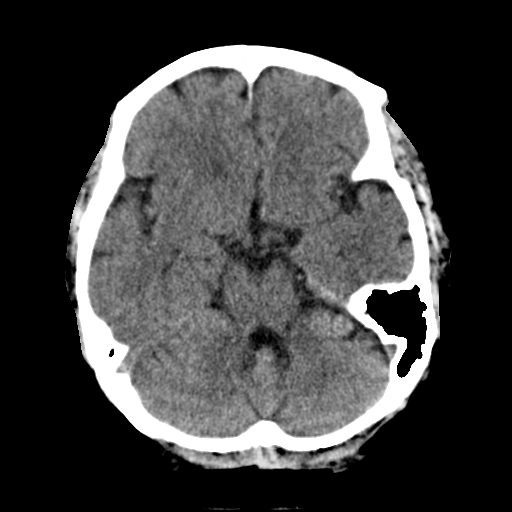
[im 10/32  brain]
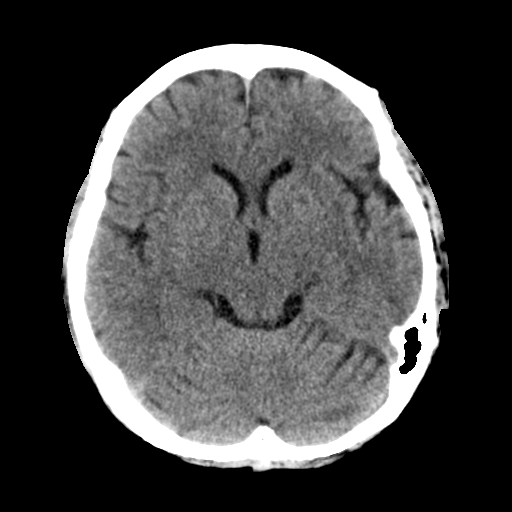
[im 10/32  bone]
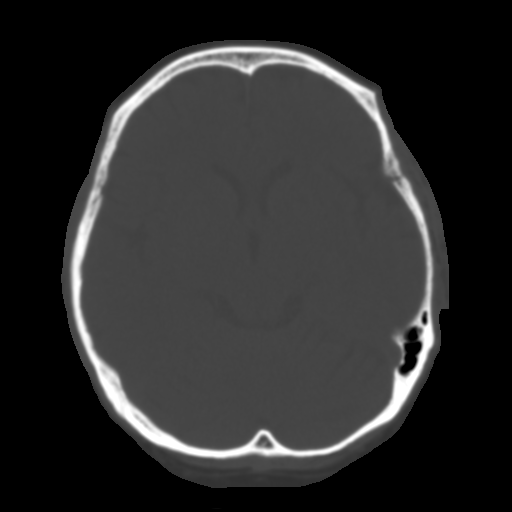
[im 12/32  brain]
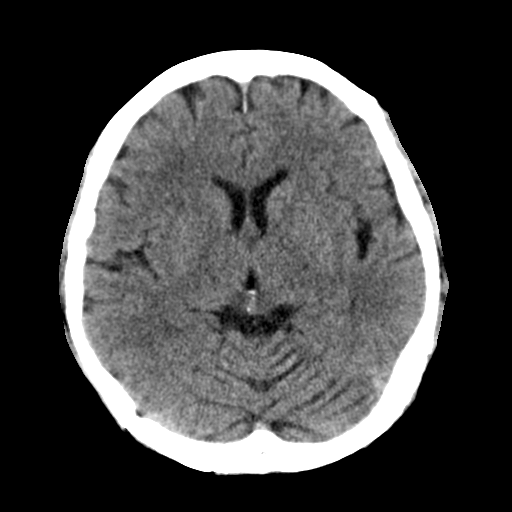
[im 14/32  brain]
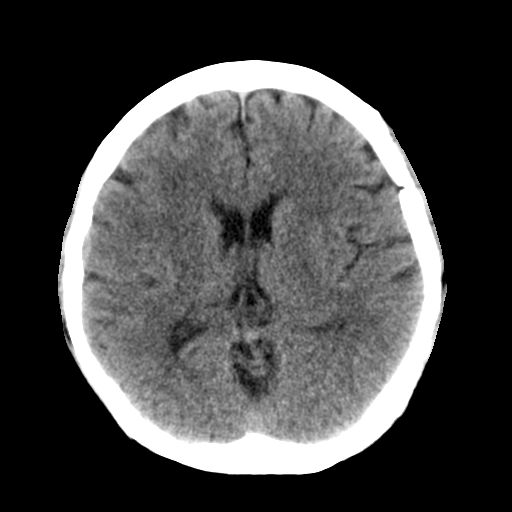
[im 17/32  brain]
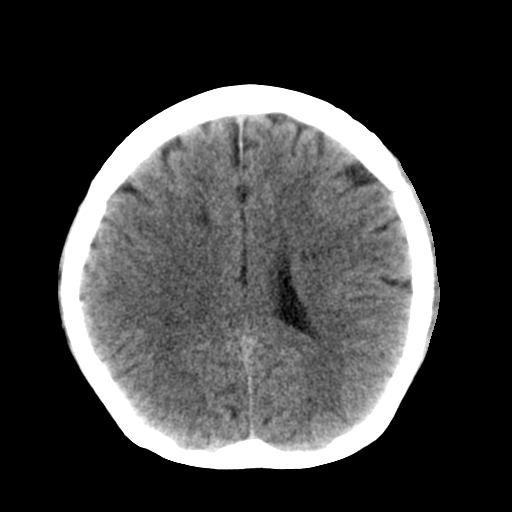
[im 18/32  brain]
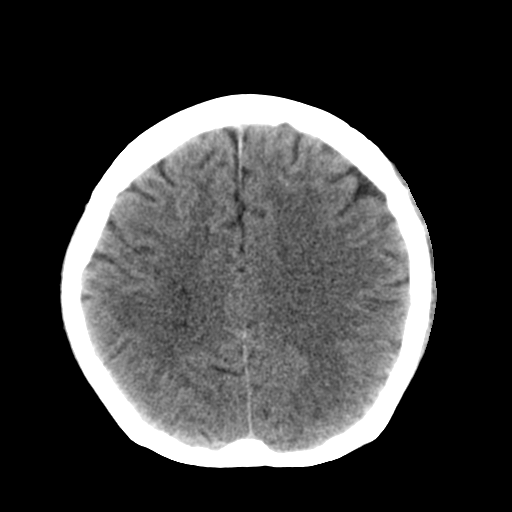
[im 18/32  bone]
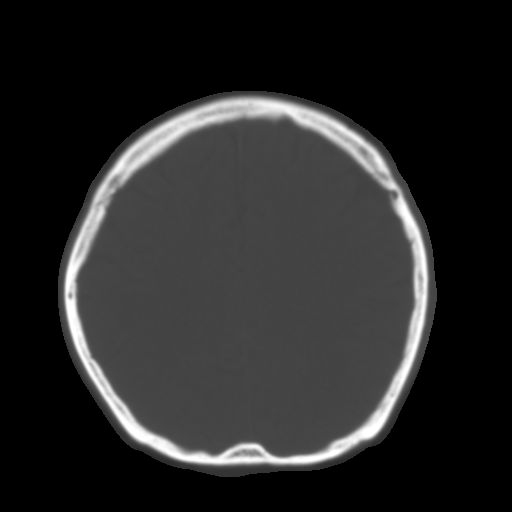
[im 20/32  brain]
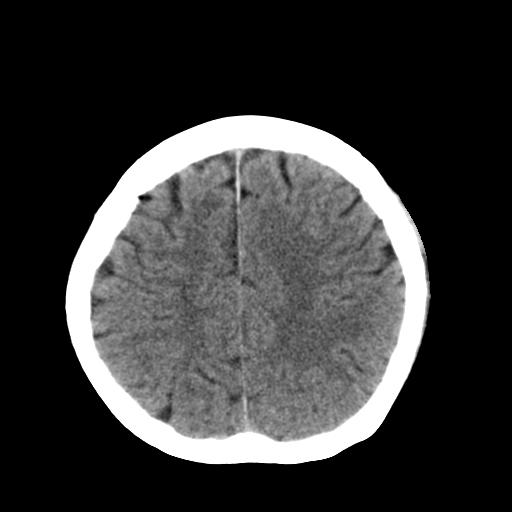
[im 22/32  brain]
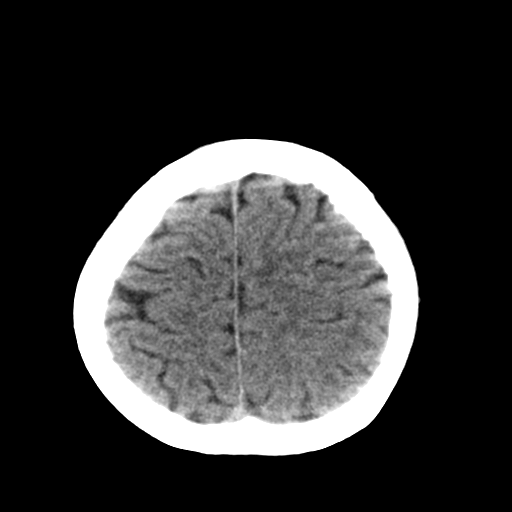
[im 24/32  brain]
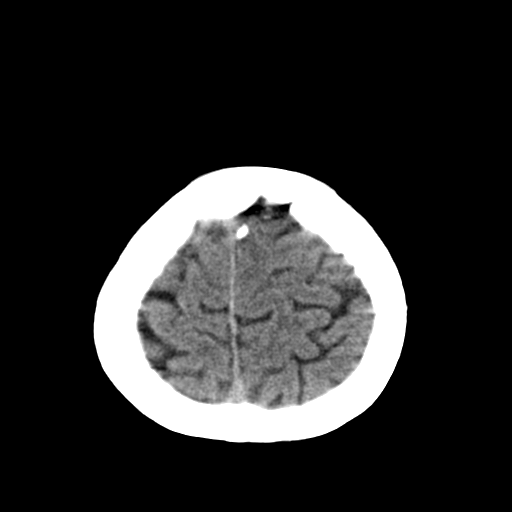
[im 26/32  brain]
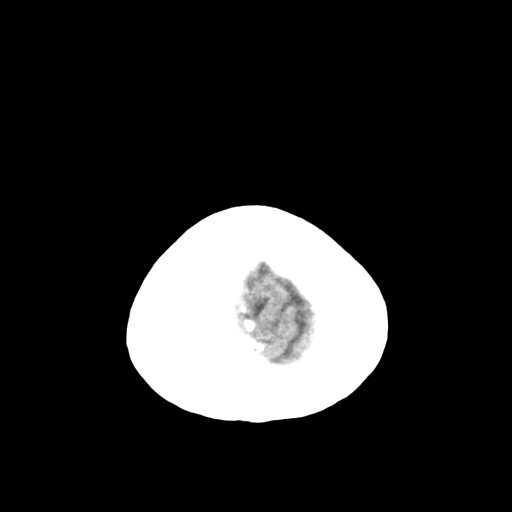
[im 26/32  bone]
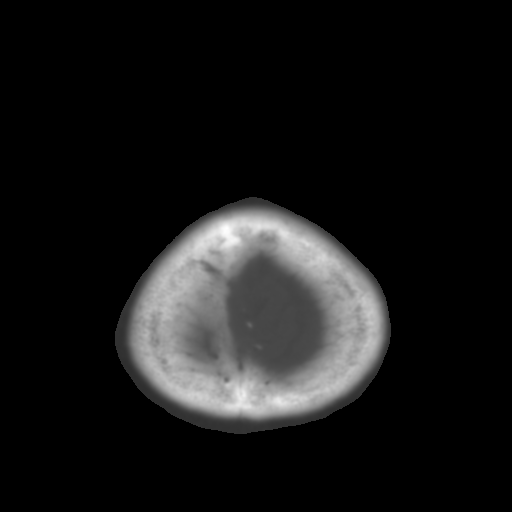
[im 28/32  brain]
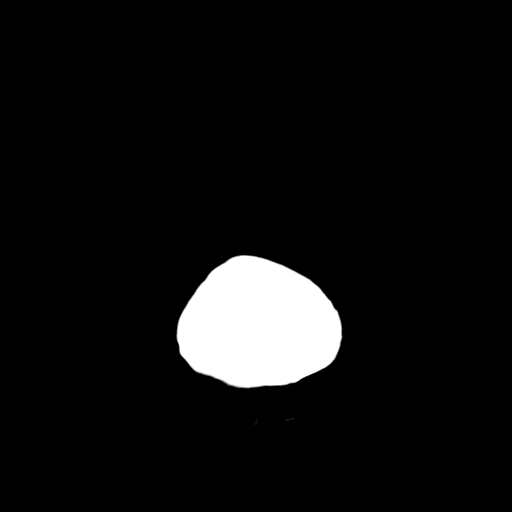
[im 30/32  brain]
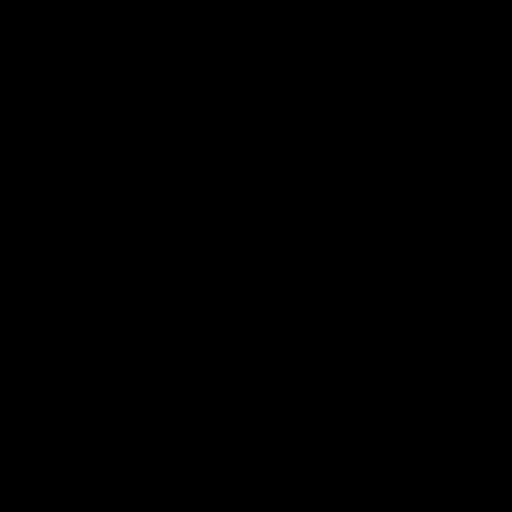

[15 of 30 positions shown; findings below may reference images not displayed]

FINDINGS: There is no evidence of mass effect, midline shift or extra-axial
fluid collections. There is no evidence of a space-occupying lesion
or intracranial hemorrhage. There is no evidence of a cortical-based
area of acute infarction. There is a small old right frontal lobe
infarct within the white matter. There is mild bilateral
periventricular white matter low attenuation likely secondary to
microangiopathy. There is a small left basal ganglia lacunar infarct
of indeterminate age.

The ventricles and sulci are appropriate for the patient's age. The
basal cisterns are patent.

Visualized portions of the orbits are unremarkable. The visualized
portions of the paranasal sinuses and mastoid air cells are
unremarkable.

The osseous structures are unremarkable.
IMPRESSION: 1. Age-indeterminate left basal ganglia lacunar infarct.

## 2015-11-04 IMAGING — US US CAROTID DUPLEX BILAT
1 series · 13 of 24 positions shown · non-contrast
Comparison: None.

CLINICAL DATA: TIA, hypertension, diabetes, hyperlipidemia,
syncope.

EXAM:
BILATERAL CAROTID DUPLEX ULTRASOUND
TECHNIQUE: Gray scale imaging, color Doppler and duplex ultrasound was
performed of bilateral carotid and vertebral arteries in the neck.

[Series 1: us carotid duplex bilat · 0.06mm/px · 13 of 67 slices shown]
[im 1/67]
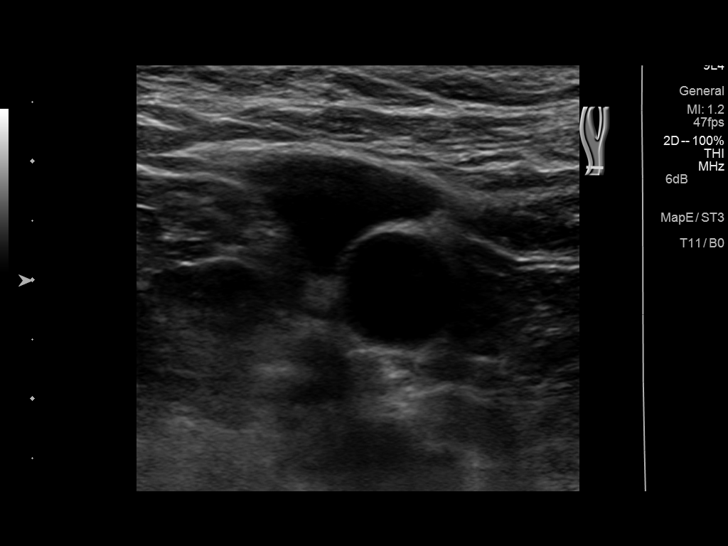
[im 6/67]
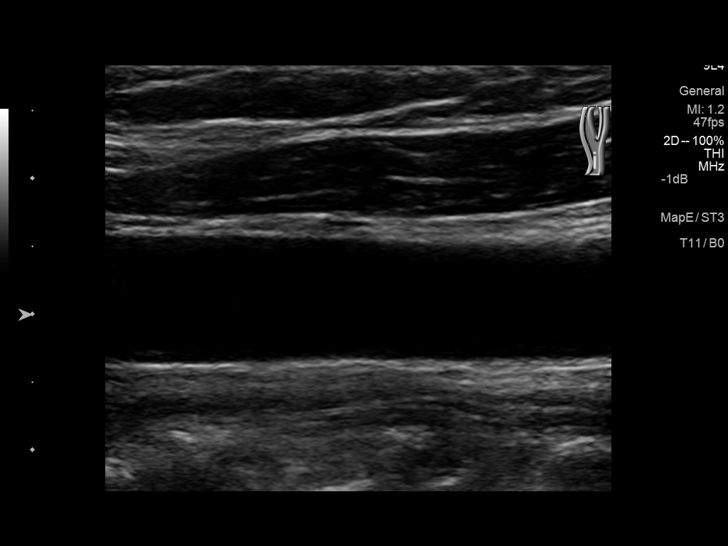
[im 12/67]
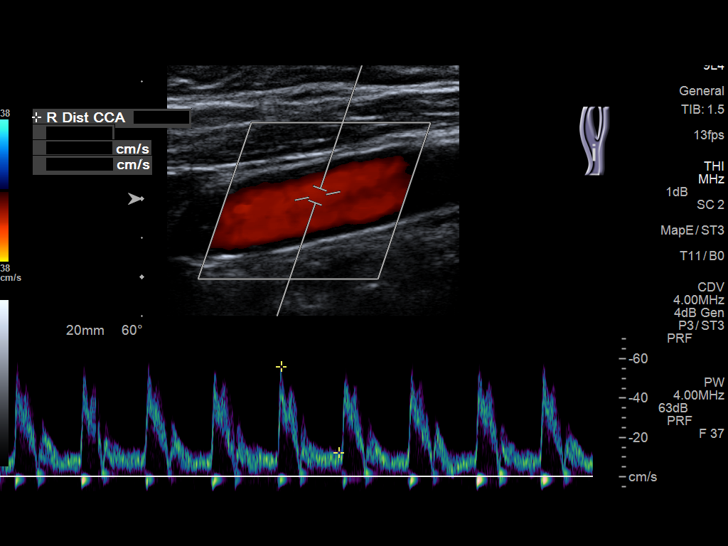
[im 18/67]
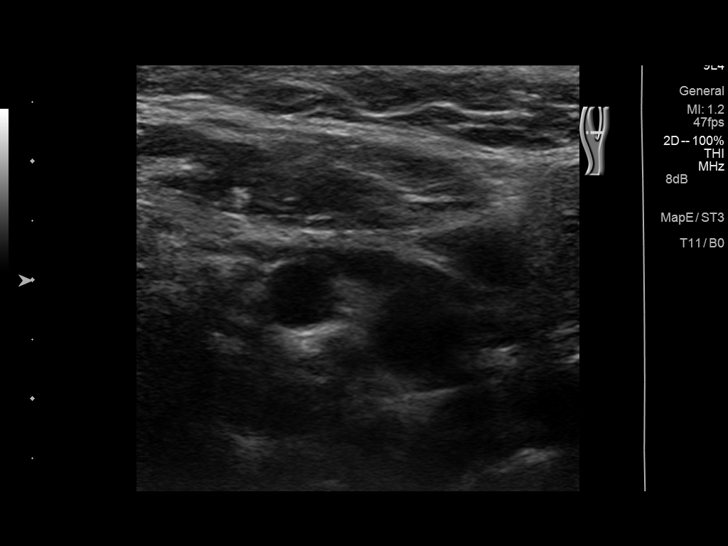
[im 23/67]
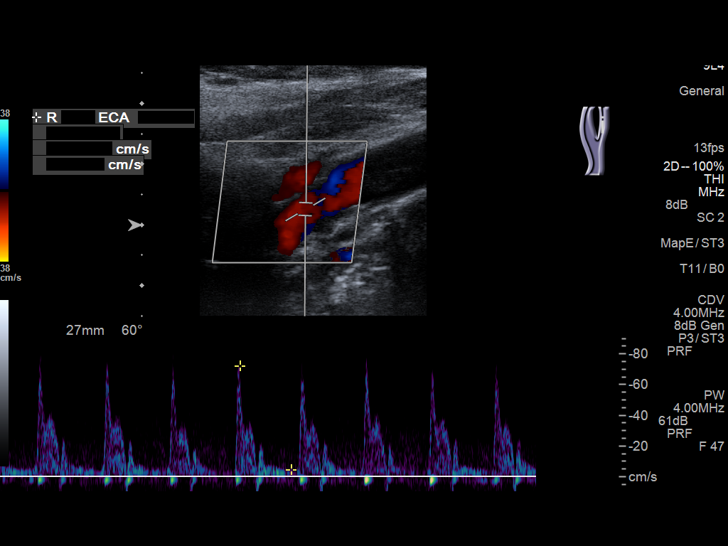
[im 29/67]
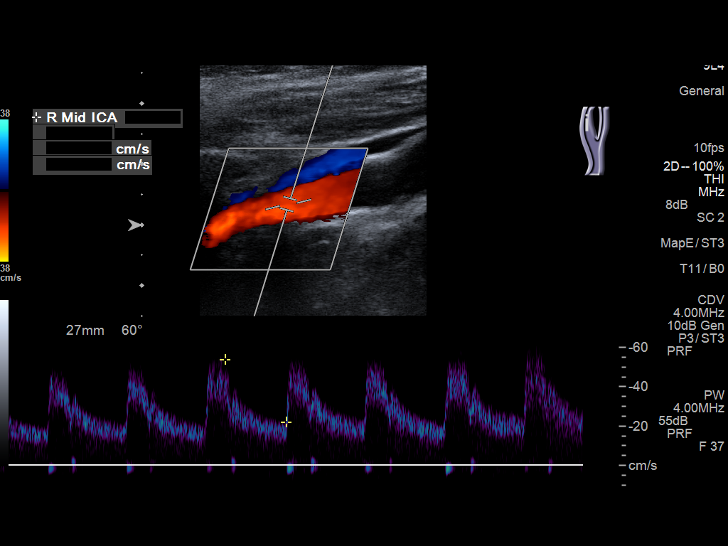
[im 35/67]
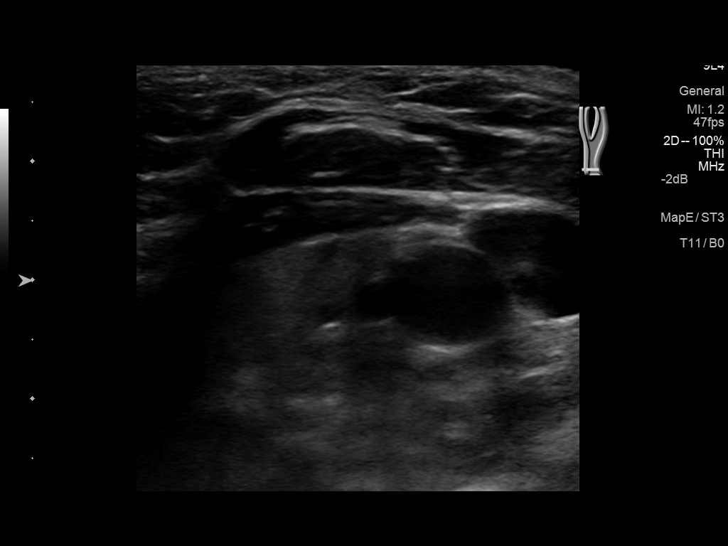
[im 38/67]
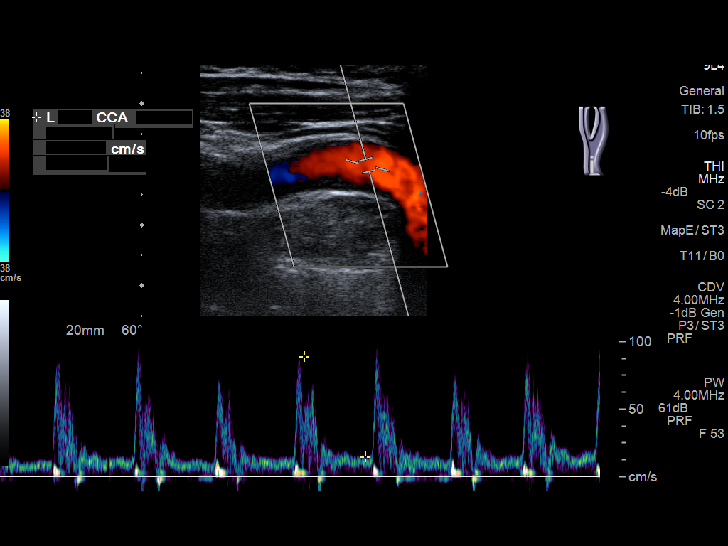
[im 44/67]
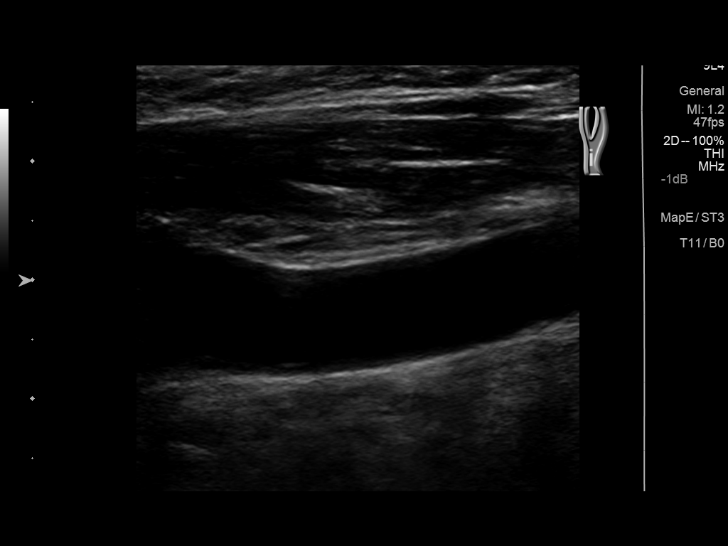
[im 49/67]
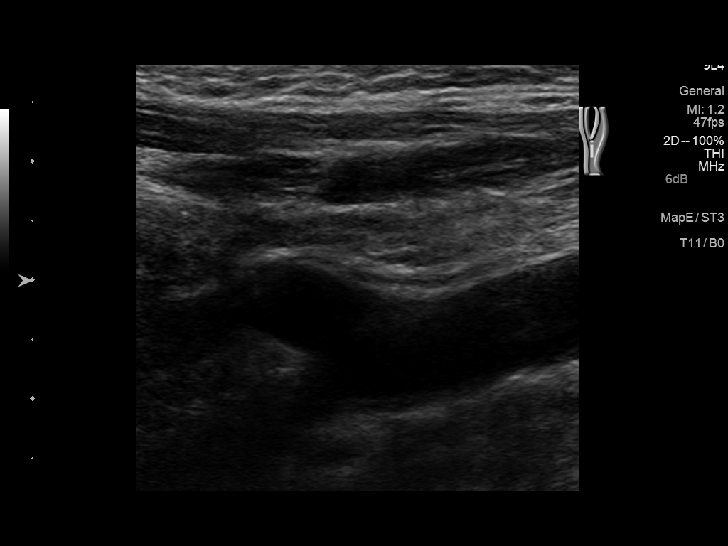
[im 55/67]
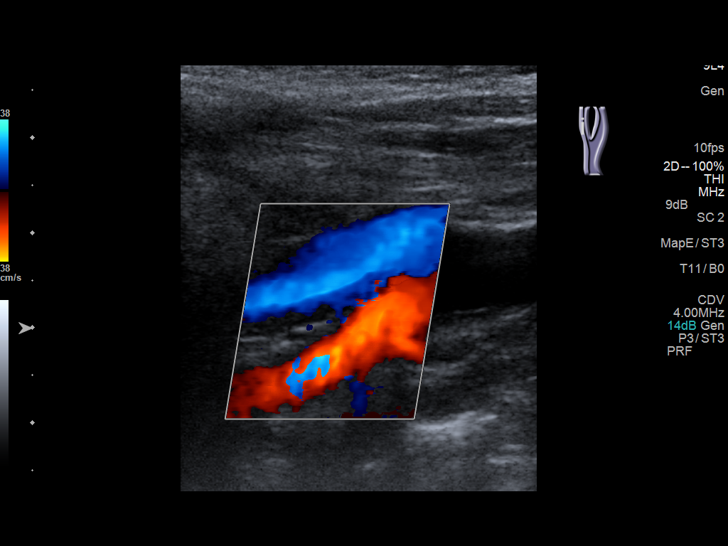
[im 61/67]
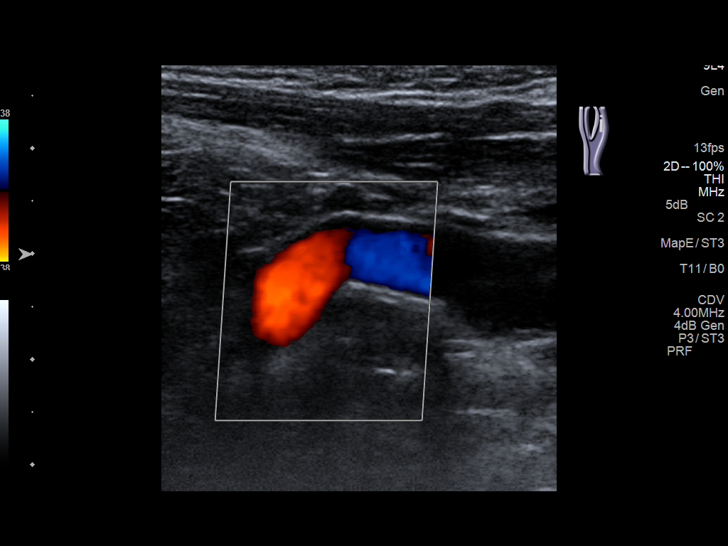
[im 67/67]
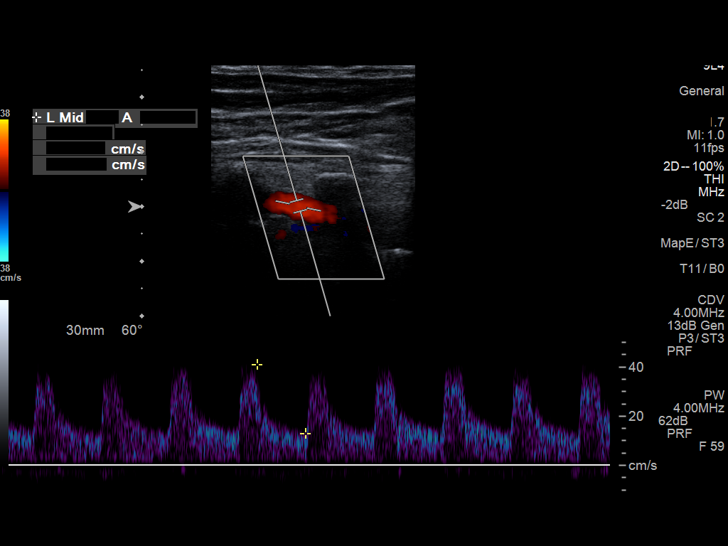

[13 of 24 positions shown; findings below may reference images not displayed]

REVIEW OF SYSTEMS:
Quantification of carotid stenosis is based on velocity parameters
that correlate the residual internal carotid diameter with
NASCET-based stenosis levels, using the diameter of the distal
internal carotid lumen as the denominator for stenosis measurement.

The following velocity measurements were obtained:

PEAK SYSTOLIC/END DIASTOLIC

RIGHT

ICA:                     65/26cm/sec

CCA:                     56/12cm/sec

SYSTOLIC ICA/CCA RATIO:

DIASTOLIC ICA/CCA RATIO:

ECA:                     72cm/sec

LEFT

ICA:                     70/17cm/sec

CCA:                     75/12cm/sec

SYSTOLIC ICA/CCA RATIO:

DIASTOLIC ICA/CCA RATIO:

ECA:                     100cm/sec
FINDINGS: RIGHT CAROTID ARTERY: Intimal thickening in the distal common
carotid artery. Mild circumferential smooth plaque at the carotid
bifurcation extending into the proximal external carotid artery. No
significant ICA stenosis. Normal waveforms and color Doppler signal.

RIGHT VERTEBRAL ARTERY:  Normal flow direction and waveform.

LEFT CAROTID ARTERY: Smooth nonocclusive plaque in the mid and
distal common carotid artery. There is some eccentric smooth
noncalcified plaque in the carotid bulb. No ICA stenosis. Mild
tortuosity of the distal ICA. Normal waveforms and color Doppler
signal.

LEFT VERTEBRAL ARTERY: Normal flow direction and waveform.
IMPRESSION: 1. Early bilateral carotid bifurcation plaque resulting in less than
50% diameter stenosis. The exam does not exclude plaque ulceration
or embolization. Continued surveillance recommended.

## 2015-12-23 ENCOUNTER — Other Ambulatory Visit: Payer: Self-pay | Admitting: Internal Medicine

## 2015-12-23 DIAGNOSIS — Z1231 Encounter for screening mammogram for malignant neoplasm of breast: Secondary | ICD-10-CM

## 2016-02-06 ENCOUNTER — Ambulatory Visit
Admission: RE | Admit: 2016-02-06 | Discharge: 2016-02-06 | Disposition: A | Discharge status: Home / Self Care | Payer: Medicare Other | Source: Ambulatory Visit | Attending: Internal Medicine | Admitting: Internal Medicine

## 2016-02-06 DIAGNOSIS — Z1231 Encounter for screening mammogram for malignant neoplasm of breast: Secondary | ICD-10-CM | POA: Insufficient documentation

## 2016-08-20 ENCOUNTER — Other Ambulatory Visit: Payer: Self-pay | Admitting: Internal Medicine

## 2016-08-20 DIAGNOSIS — M7989 Other specified soft tissue disorders: Secondary | ICD-10-CM

## 2016-08-24 ENCOUNTER — Ambulatory Visit
Admission: RE | Admit: 2016-08-24 | Discharge: 2016-08-24 | Disposition: A | Discharge status: Home / Self Care | Payer: Medicare Other | Source: Ambulatory Visit | Attending: Internal Medicine | Admitting: Internal Medicine

## 2016-08-24 DIAGNOSIS — M7989 Other specified soft tissue disorders: Secondary | ICD-10-CM

## 2016-09-28 ENCOUNTER — Other Ambulatory Visit: Payer: Self-pay | Admitting: Internal Medicine

## 2016-09-28 DIAGNOSIS — R55 Syncope and collapse: Secondary | ICD-10-CM

## 2016-10-19 ENCOUNTER — Ambulatory Visit
Admission: RE | Admit: 2016-10-19 | Discharge: 2016-10-19 | Disposition: A | Discharge status: Home / Self Care | Payer: Medicare Other | Source: Ambulatory Visit | Attending: Internal Medicine | Admitting: Internal Medicine

## 2016-10-19 DIAGNOSIS — I6521 Occlusion and stenosis of right carotid artery: Secondary | ICD-10-CM | POA: Insufficient documentation

## 2016-10-19 DIAGNOSIS — R55 Syncope and collapse: Secondary | ICD-10-CM | POA: Diagnosis present

## 2017-01-04 ENCOUNTER — Other Ambulatory Visit: Payer: Self-pay | Admitting: Internal Medicine

## 2017-01-04 DIAGNOSIS — Z1231 Encounter for screening mammogram for malignant neoplasm of breast: Secondary | ICD-10-CM

## 2017-02-06 ENCOUNTER — Ambulatory Visit
Admission: RE | Admit: 2017-02-06 | Discharge: 2017-02-06 | Disposition: A | Discharge status: Home / Self Care | Payer: Medicare Other | Source: Ambulatory Visit | Attending: Internal Medicine | Admitting: Internal Medicine

## 2017-02-06 DIAGNOSIS — Z1231 Encounter for screening mammogram for malignant neoplasm of breast: Secondary | ICD-10-CM | POA: Diagnosis present

## 2017-10-02 ENCOUNTER — Encounter: Payer: Self-pay | Admitting: *Deleted

## 2017-10-02 ENCOUNTER — Encounter
Admission: RE | Disposition: A | Discharge status: Home / Self Care | Payer: Self-pay | Source: Ambulatory Visit | Attending: Unknown Physician Specialty

## 2017-10-02 ENCOUNTER — Ambulatory Visit: Payer: Medicare Other | Admitting: Certified Registered Nurse Anesthetist

## 2017-10-02 ENCOUNTER — Ambulatory Visit
Admission: RE | Admit: 2017-10-02 | Discharge: 2017-10-02 | Disposition: A | Discharge status: Home / Self Care | Payer: Medicare Other | Source: Ambulatory Visit | Attending: Unknown Physician Specialty | Admitting: Unknown Physician Specialty

## 2017-10-02 DIAGNOSIS — E119 Type 2 diabetes mellitus without complications: Secondary | ICD-10-CM | POA: Insufficient documentation

## 2017-10-02 DIAGNOSIS — Z794 Long term (current) use of insulin: Secondary | ICD-10-CM | POA: Diagnosis not present

## 2017-10-02 DIAGNOSIS — Z79899 Other long term (current) drug therapy: Secondary | ICD-10-CM | POA: Diagnosis not present

## 2017-10-02 DIAGNOSIS — E785 Hyperlipidemia, unspecified: Secondary | ICD-10-CM | POA: Diagnosis not present

## 2017-10-02 DIAGNOSIS — Z7982 Long term (current) use of aspirin: Secondary | ICD-10-CM | POA: Insufficient documentation

## 2017-10-02 DIAGNOSIS — K2270 Barrett's esophagus without dysplasia: Secondary | ICD-10-CM | POA: Insufficient documentation

## 2017-10-02 DIAGNOSIS — D649 Anemia, unspecified: Secondary | ICD-10-CM | POA: Insufficient documentation

## 2017-10-02 DIAGNOSIS — K2960 Other gastritis without bleeding: Secondary | ICD-10-CM | POA: Insufficient documentation

## 2017-10-02 DIAGNOSIS — K210 Gastro-esophageal reflux disease with esophagitis: Secondary | ICD-10-CM | POA: Diagnosis not present

## 2017-10-02 DIAGNOSIS — I69354 Hemiplegia and hemiparesis following cerebral infarction affecting left non-dominant side: Secondary | ICD-10-CM | POA: Diagnosis not present

## 2017-10-02 DIAGNOSIS — Z7902 Long term (current) use of antithrombotics/antiplatelets: Secondary | ICD-10-CM | POA: Diagnosis not present

## 2017-10-02 DIAGNOSIS — I10 Essential (primary) hypertension: Secondary | ICD-10-CM | POA: Insufficient documentation

## 2017-10-02 DIAGNOSIS — K2950 Unspecified chronic gastritis without bleeding: Secondary | ICD-10-CM | POA: Diagnosis not present

## 2017-10-02 DIAGNOSIS — K259 Gastric ulcer, unspecified as acute or chronic, without hemorrhage or perforation: Secondary | ICD-10-CM | POA: Diagnosis not present

## 2017-10-02 DIAGNOSIS — Z79890 Hormone replacement therapy (postmenopausal): Secondary | ICD-10-CM | POA: Diagnosis not present

## 2017-10-02 DIAGNOSIS — Z09 Encounter for follow-up examination after completed treatment for conditions other than malignant neoplasm: Secondary | ICD-10-CM | POA: Diagnosis present

## 2017-10-02 HISTORY — PX: ESOPHAGOGASTRODUODENOSCOPY (EGD) WITH PROPOFOL: SHX5813

## 2017-10-02 LAB — GLUCOSE, CAPILLARY: Glucose-Capillary: 280 mg/dL — ABNORMAL HIGH (ref 70–99)

## 2017-10-02 SURGERY — ESOPHAGOGASTRODUODENOSCOPY (EGD) WITH PROPOFOL
Anesthesia: General

## 2017-10-02 MED ORDER — PROPOFOL 500 MG/50ML IV EMUL
INTRAVENOUS | Status: AC
  Filled 2017-10-02: qty 50

## 2017-10-02 MED ORDER — LIDOCAINE HCL (CARDIAC) PF 100 MG/5ML IV SOSY
PREFILLED_SYRINGE | INTRAVENOUS | Status: DC | PRN
  Administered 2017-10-02: 50 mg via INTRAVENOUS

## 2017-10-02 MED ORDER — PROPOFOL 500 MG/50ML IV EMUL
INTRAVENOUS | Status: DC | PRN
  Administered 2017-10-02: 175 ug/kg/min via INTRAVENOUS

## 2017-10-02 MED ORDER — SODIUM CHLORIDE 0.9 % IV SOLN: INTRAVENOUS | Status: DC

## 2017-10-02 MED ORDER — PROPOFOL 10 MG/ML IV BOLUS
INTRAVENOUS | Status: DC | PRN
  Administered 2017-10-02: 20 mg via INTRAVENOUS
  Administered 2017-10-02: 60 mg via INTRAVENOUS

## 2017-10-02 MED ORDER — SODIUM CHLORIDE 0.9 % IV SOLN
INTRAVENOUS | Status: DC
  Administered 2017-10-02: 09:00:00 via INTRAVENOUS

## 2017-10-02 NOTE — H&P (Signed)
Primary Care Physician:  Idelle Crouch, MD Primary Gastroenterologist:  Dr. Vira Agar  Pre-Procedure History & Physical: HPI:  Emily Barber is a 67 y.o. female is here for an  Endoscopy.  This is for follow up intestinal metaplasia seen on previous EGD 08/2015.   Past Medical History:  Diagnosis Date  . Anemia   . Chronic fatigue   . DDD (degenerative disc disease), cervical   . Diabetes mellitus without complication ([place])   . Dysphagia   . Hyperlipidemia   . Hypertension   . Pyelonephritis   . Seizures ([place])   . Stroke [facility])     Past Surgical History:  Procedure Laterality Date  . CESAREAN SECTION    . COLONOSCOPY N/A 10/05/2015   Procedure: COLONOSCOPY;  Surgeon: Manya Silvas, MD;  Location: [facility] ENDOSCOPY;  Service: Endoscopy;  Laterality: N/A;  . ESOPHAGOGASTRODUODENOSCOPY (EGD) WITH PROPOFOL N/A 09/07/2015   Procedure: ESOPHAGOGASTRODUODENOSCOPY (EGD) WITH PROPOFOL;  Surgeon: Manya Silvas, MD;  Location: [facility] ENDOSCOPY;  Service: Endoscopy;  Laterality: N/A;    Prior to Admission medications   Medication Sig Start Date End Date Taking? Authorizing Provider  Coenzyme Q10 100 MG capsule Take 100 mg by mouth daily.   Yes [provider]  Insulin Glargine-Lixisenatide (SOLIQUA) 100-33 UNT-MCG/ML SOPN Inject 15-35 Units into the skin.   Yes [provider]  losartan (COZAAR) 100 MG tablet Take 100 mg by mouth daily.   Yes [provider]  metoprolol succinate (TOPROL-XL) 100 MG 24 hr tablet Take 100 mg by mouth daily. Take with or immediately following a meal.   Yes [provider]  multivitamin-lutein (OCUVITE-LUTEIN) CAPS capsule Take 1 capsule by mouth daily.   Yes [provider]  omega-3 acid ethyl esters (LOVAZA) 1 g capsule Take 1 g by mouth 2 (two) times daily.   Yes [provider]  pantoprazole (PROTONIX) 40 MG tablet Take 40 mg by mouth daily.   Yes [provider]  aspirin EC 81 MG  tablet Take 81 mg by mouth daily.    [provider]  clopidogrel (PLAVIX) 75 MG tablet Take 75 mg by mouth daily.    [provider]  ESTRACE VAGINAL 0.1 MG/GM vaginal cream APPLY ONE APPLICATORFUL VAGINALLY THREE TIMES A WEEK 01/11/15   Herring, Orville Govern, NP  ferrous sulfate 325 (65 FE) MG tablet Take 325 mg by mouth daily with breakfast.    [provider]  glipiZIDE (GLUCOTROL) 10 MG tablet Take 10 mg by mouth daily before breakfast.    [provider]  hydrochlorothiazide (HYDRODIURIL) 25 MG tablet Take 25 mg by mouth daily.    [provider]  Insulin Degludec (TRESIBA FLEXTOUCH) 200 UNIT/ML SOPN Inject 50 Units into the skin daily.    [provider]  isosorbide mononitrate (IMDUR) 30 MG 24 hr tablet Take 30 mg by mouth 2 (two) times daily.    [provider]  metFORMIN (GLUCOPHAGE) 1000 MG tablet Take 1,000 mg by mouth 2 (two) times daily with a meal.    [provider]  MULTIPLE VITAMIN PO Take 1 tablet by mouth daily.    [provider]  pioglitazone (ACTOS) 15 MG tablet Take 15 mg by mouth daily.    [provider]  potassium chloride SA (K-DUR,KLOR-CON) 20 MEQ tablet Take 20 mEq by mouth daily.    [provider]  simvastatin (ZOCOR) 20 MG tablet Take 20 mg by mouth daily.    [provider]  valsartan (DIOVAN) 160 MG tablet Take 160 mg by mouth daily.    [provider]    Allergies as of 08/07/2017 - Review Complete 10/05/2015  Allergen Reaction Noted  . Povidone iodine  09/06/2015    History reviewed. No pertinent family history.  Social History   Socioeconomic History  . Marital status: Married    Spouse name: Not on file  . Number of children: Not on file  . Years of education: Not on file  . Highest education level: Not on file  Occupational History  . Not on file  Social Needs  . Financial resource strain: Not on file  . Food insecurity:    Worry:  Not on file    Inability: Not on file  . Transportation needs:    Medical: Not on file    Non-medical: Not on file  Tobacco Use  . Smoking status: Never Smoker  . Smokeless tobacco: Never Used  Substance and Sexual Activity  . Alcohol use: No  . Drug use: No  . Sexual activity: Not on file  Lifestyle  . Physical activity:    Days per week: Not on file    Minutes per session: Not on file  . Stress: Not on file  Relationships  . Social connections:    Talks on phone: Not on file    Gets together: Not on file    Attends religious service: Not on file    Active member of club or organization: Not on file    Attends meetings of clubs or organizations: Not on file    Relationship status: Not on file  . Intimate partner violence:    Fear of current or ex partner: Not on file    Emotionally abused: Not on file    Physically abused: Not on file    Forced sexual activity: Not on file  Other Topics Concern  . Not on file  Social History Narrative  . Not on file    Review of Systems: See HPI, otherwise negative ROS  Physical Exam: BP (!) 159/83   Pulse 69   Temp 98.4 F (36.9 C)   Resp 20   Ht 5\' 4"  (1.626 m)   Wt 73.5 kg   SpO2 98%   BMI 27.81 kg/m  General:   Alert,  pleasant and cooperative in NAD Head:  Normocephalic and atraumatic. Neck:  Supple; no masses or thyromegaly. Lungs:  Clear throughout to auscultation.    Heart:  Regular rate and rhythm. Abdomen:  Soft, nontender and nondistended. Normal bowel sounds, without guarding, and without rebound.   Neurologic:  Alert and  oriented x4;  grossly normal neurologically.  Impression/Plan: Emily Barber is here for an endoscopy to be performed for PH intestinal metaplasia of the GEJ.  Risks, benefits, limitations, and alternatives regarding  endoscopy have been reviewed with the patient.  Questions have been answered.  All parties agreeable.   Gaylyn Cheers, MD  10/02/2017, 9:06 AM

## 2017-10-02 NOTE — Transfer of Care (Signed)
Immediate Anesthesia Transfer of Care Note  Patient: Emily Barber  Procedure(s) Performed: ESOPHAGOGASTRODUODENOSCOPY (EGD) WITH PROPOFOL (N/A )  Patient Location: PACU  Anesthesia Type:General  Level of Consciousness: sedated  Airway & Oxygen Therapy: Patient Spontanous Breathing and Patient connected to nasal cannula oxygen  Post-op Assessment: Report given to RN and Post -op Vital signs reviewed and stable  Post vital signs: Reviewed and stable  Last Vitals:  Vitals Value Taken Time  BP 111/69 10/02/2017  9:30 AM  Temp 36.3 C 10/02/2017  9:30 AM  Pulse 79 10/02/2017  9:30 AM  Resp 23 10/02/2017  9:30 AM  SpO2 97 % 10/02/2017  9:30 AM  Vitals shown include unvalidated device data.  Last Pain:  Vitals:   10/02/17 0930  TempSrc: Oral  PainSc: Asleep         Complications: No apparent anesthesia complications

## 2017-10-02 NOTE — Anesthesia Post-op Follow-up Note (Signed)
Anesthesia QCDR form completed.        

## 2017-10-02 NOTE — Op Note (Signed)
[facility] Patient Name: Emily Barber Procedure Date: 10/02/2017 9:00 AM MRN: [id number] Account #: [id number] Date of Birth: September 02, 1950 Admit Type: Outpatient Age: 67 Room: [facility] ENDO ROOM 3 Gender: Female Note Status: Finalized Procedure:            Upper GI endoscopy Indications:          Follow-up of Barrett's esophagus Providers:            Manya Silvas, MD Referring MD:         Leonie Douglas. Doy Hutching, MD (Referring MD) Medicines:            Propofol per Anesthesia Complications:        No immediate complications. Procedure:            Pre-Anesthesia Assessment:                       - After reviewing the risks and benefits, the patient                        was deemed in satisfactory condition to undergo the                        procedure.                       After obtaining informed consent, the endoscope was                        passed under direct vision. Throughout the procedure,                        the patient's blood pressure, pulse, and oxygen                        saturations were monitored continuously. The Endoscope                        was introduced through the mouth, and advanced to the                        second part of duodenum. The upper GI endoscopy was                        accomplished without difficulty. The patient tolerated                        the procedure well. Findings:      LA Grade A (one or more mucosal breaks less than 5 mm, not extending       between tops of 2 mucosal folds) esophagitis with no bleeding was found       39 cm from the incisors. Biopsies were taken with a cold forceps for       histology.      One non-bleeding cratered and superficial gastric ulcer with no stigmata       of bleeding was found on the greater curvature of the stomach. The       lesion was 3 mm in largest dimension. Biopsy done of the ulcer with       minimal bleeding.      The examined duodenum was  normal. Impression:           -  LA Grade A reflux esophagitis. Rule out Barrett's                        esophagus. Biopsied.                       - Non-bleeding gastric ulcer with no stigmata of                        bleeding.                       - Normal examined duodenum. Recommendation:       - Await pathology results. Manya Silvas, MD 10/02/2017 9:28:49 AM This report has been signed electronically. Number of Addenda: 0 Note Initiated On: 10/02/2017 9:00 AM      [facility]

## 2017-10-02 NOTE — Anesthesia Procedure Notes (Signed)
Date/Time: 10/02/2017 9:12 AM Performed by: Johnna Acosta, CRNA Pre-anesthesia Checklist: Patient identified, Emergency Drugs available, Suction available, Patient being monitored and Timeout performed Patient Re-evaluated:Patient Re-evaluated prior to induction Oxygen Delivery Method: Nasal cannula Preoxygenation: Pre-oxygenation with 100% oxygen Induction Type: IV induction

## 2017-10-02 NOTE — Anesthesia Preprocedure Evaluation (Signed)
Anesthesia Evaluation  Patient identified by MRN, date of birth, ID band Patient awake    Reviewed: Allergy & Precautions, NPO status , Patient's Chart, lab work & pertinent test results  History of Anesthesia Complications Negative for: history of anesthetic complications  Airway Mallampati: II       Dental  (+) Dental Advidsory Given, Teeth Intact   Pulmonary neg pulmonary ROS,           Cardiovascular Exercise Tolerance: Good hypertension, Pt. on medications and Pt. on home beta blockers (-) angina(-) CAD, (-) Past MI, (-) Cardiac Stents and (-) CABG (-) dysrhythmias (-) Valvular Problems/Murmurs     Neuro/Psych Seizures - (only with hypoglycemia),  CVA (l sided weakness), Residual Symptoms    GI/Hepatic GERD  Medicated,  Endo/Other  diabetes, Type 2, Oral Hypoglycemic Agents  Renal/GU Renal disease (Pyelonephritis)  negative genitourinary   Musculoskeletal  (+) Arthritis , Osteoarthritis,    Abdominal   Peds  Hematology  (+) anemia ,   Anesthesia Other Findings Past Medical History: No date: Anemia No date: Chronic fatigue No date: DDD (degenerative disc disease), cervical No date: Diabetes mellitus without complication (HCC) No date: Dysphagia No date: Hyperlipidemia No date: Hypertension No date: Pyelonephritis No date: Seizures ([place]) No date: Stroke [facility])   Reproductive/Obstetrics                             Anesthesia Physical  Anesthesia Plan  ASA: III  Anesthesia Plan: General   Post-op Pain Management:    Induction: Intravenous  PONV Risk Score and Plan: 2 and Propofol infusion and TIVA  Airway Management Planned: Nasal Cannula  Additional Equipment:   Intra-op Plan:   Post-operative Plan:   Informed Consent: I have reviewed the patients History and Physical, chart, labs and discussed the procedure including the risks, benefits and alternatives for the  proposed anesthesia with the patient or authorized representative who has indicated his/her understanding and acceptance.     Plan Discussed with:   Anesthesia Plan Comments:         Anesthesia Quick Evaluation

## 2017-10-03 ENCOUNTER — Encounter: Payer: Self-pay | Admitting: Unknown Physician Specialty

## 2017-10-03 LAB — SURGICAL PATHOLOGY

## 2017-10-03 NOTE — Anesthesia Postprocedure Evaluation (Signed)
Anesthesia Post Note  Patient: Emily Barber  Procedure(s) Performed: ESOPHAGOGASTRODUODENOSCOPY (EGD) WITH PROPOFOL (N/A )  Patient location during evaluation: Endoscopy Anesthesia Type: General Level of consciousness: awake and alert Pain management: pain level controlled Vital Signs Assessment: post-procedure vital signs reviewed and stable Respiratory status: spontaneous breathing, nonlabored ventilation, respiratory function stable and patient connected to nasal cannula oxygen Cardiovascular status: blood pressure returned to baseline and stable Postop Assessment: no apparent nausea or vomiting Anesthetic complications: no     Last Vitals:  Vitals:   10/02/17 0849 10/02/17 0930  BP: (!) 159/83 111/69  Pulse: 69 76  Resp: 20 (!) 23  Temp: 36.9 C (!) 36.3 C  SpO2: 98% 97%    Last Pain:  Vitals:   10/03/17 0803  TempSrc:   PainSc: 0-No pain                 Martha Clan

## 2017-12-18 ENCOUNTER — Other Ambulatory Visit: Payer: Self-pay | Admitting: Internal Medicine

## 2017-12-18 DIAGNOSIS — Z1231 Encounter for screening mammogram for malignant neoplasm of breast: Secondary | ICD-10-CM

## 2018-02-06 ENCOUNTER — Ambulatory Visit
Admission: RE | Admit: 2018-02-06 | Discharge: 2018-02-06 | Disposition: A | Discharge status: Home / Self Care | Payer: Medicare Other | Source: Ambulatory Visit | Attending: Internal Medicine | Admitting: Internal Medicine

## 2018-02-06 DIAGNOSIS — Z1231 Encounter for screening mammogram for malignant neoplasm of breast: Secondary | ICD-10-CM | POA: Diagnosis present

## 2018-12-17 ENCOUNTER — Other Ambulatory Visit: Payer: Self-pay | Admitting: Internal Medicine

## 2018-12-17 DIAGNOSIS — Z1231 Encounter for screening mammogram for malignant neoplasm of breast: Secondary | ICD-10-CM

## 2019-02-11 ENCOUNTER — Ambulatory Visit
Admission: RE | Admit: 2019-02-11 | Discharge: 2019-02-11 | Disposition: A | Discharge status: Home / Self Care | Payer: Medicare Other | Source: Ambulatory Visit | Attending: Internal Medicine | Admitting: Internal Medicine

## 2019-02-11 DIAGNOSIS — Z1231 Encounter for screening mammogram for malignant neoplasm of breast: Secondary | ICD-10-CM | POA: Diagnosis present

## 2019-02-28 ENCOUNTER — Ambulatory Visit: Payer: Medicare Other | Attending: Internal Medicine

## 2019-02-28 DIAGNOSIS — Z23 Encounter for immunization: Secondary | ICD-10-CM

## 2019-02-28 NOTE — Progress Notes (Signed)
Patient presented to observation area @11am , patient presented with her son. This was the patient's first vaccine.  While reviewing signs/symptoms of reactions with patient/patient's son, during this time patient began to c/o dizziness and blurred vision. 11:07 vs: 174/70, HR: 69, RR: 20.  Patient denied chest pain, SOB, difficulty breathing, rash or other concerns. Asked the patient/son to review PMH and allergies. Patient with a history of DM, allergy to betadine. 1112: 159/64, HR: 72, RR: 18. Patient verbalized to her son that she had only a cup of coffee for breakfast.  Patient was given peanut butter crackers and water, patient was able to consume without difficulty. FSBS- 283.  Patient continued to be observed for a total of 30 minutes with improvement of dizziness and blurred vision. Patient was ambulatory with cane assist at time of check out with son present. Reviewed indications with patient's son to follow up in the ER to include: chest pain, SOB, difficulty breathing, rash, increased heart rate, swelling of face/throat/neck, worsening dizziness or weakness. Patient discharged to home @ 1135am. D/C VS: 146/61, HR: 70, RR: 20.

## 2019-02-28 NOTE — Progress Notes (Signed)
   Covid-19 Vaccination Clinic  Name:  Emily Barber    MRN: [id number] DOB: Sep 19, 1950  02/28/2019  Emily Barber was observed post Covid-19 immunization for 15 minutes with incidence. She was provided with Vaccine Information Sheet and instruction to access the V-Safe system. Did have dizziness and was observed for 30 mins and was given water.  Symptoms resolved.   Emily Barber was instructed to call 911 with any severe reactions post vaccine: Marland Kitchen Difficulty breathing  . Swelling of your face and throat  . A fast heartbeat  . A bad rash all over your body  . Dizziness and weakness    Immunizations Administered    Name Date Dose VIS Date Route   Pfizer COVID-19 Vaccine 02/28/2019 10:56 AM 0.3 mL 12/19/2018 Intramuscular   Manufacturer: [place]   Lot: [id number]   [place]: [id number]

## 2019-03-24 ENCOUNTER — Ambulatory Visit: Payer: Medicare Other

## 2019-03-28 ENCOUNTER — Other Ambulatory Visit: Payer: Self-pay

## 2019-03-28 ENCOUNTER — Ambulatory Visit: Payer: Medicare Other | Attending: Internal Medicine

## 2019-03-28 DIAGNOSIS — Z23 Encounter for immunization: Secondary | ICD-10-CM

## 2019-03-28 NOTE — Progress Notes (Signed)
   Covid-19 Vaccination Clinic  Name:  Emily Barber    MRN: [id number] DOB: 27-Jan-1950  03/28/2019  [name] was observed post Covid-19 immunization for 15 minutes without incident. She was provided with Vaccine Information Sheet and instruction to access the V-Safe system.   [name] was instructed to call 911 with any severe reactions post vaccine: Marland Kitchen Difficulty breathing  . Swelling of face and throat  . A fast heartbeat  . A bad rash all over body  . Dizziness and weakness   Immunizations Administered    Name Date Dose VIS Date Route   Pfizer COVID-19 Vaccine 03/28/2019  9:47 AM 0.3 mL 12/19/2018 Intramuscular   Manufacturer: [place]   Lot: [id number]   [place]: [id number]

## 2019-08-31 ENCOUNTER — Other Ambulatory Visit: Payer: Self-pay | Admitting: Internal Medicine

## 2019-08-31 DIAGNOSIS — R197 Diarrhea, unspecified: Secondary | ICD-10-CM

## 2019-09-07 ENCOUNTER — Other Ambulatory Visit
Admission: RE | Admit: 2019-09-07 | Discharge: 2019-09-07 | Disposition: A | Discharge status: Home / Self Care | Payer: Medicare Other | Source: Ambulatory Visit | Attending: Internal Medicine | Admitting: Internal Medicine

## 2019-09-07 ENCOUNTER — Other Ambulatory Visit: Payer: Self-pay

## 2019-09-07 DIAGNOSIS — Z20822 Contact with and (suspected) exposure to covid-19: Secondary | ICD-10-CM | POA: Insufficient documentation

## 2019-09-07 DIAGNOSIS — Z01812 Encounter for preprocedural laboratory examination: Secondary | ICD-10-CM | POA: Diagnosis present

## 2019-09-07 LAB — SARS CORONAVIRUS 2 (TAT 6-24 HRS): SARS Coronavirus 2: NEGATIVE

## 2019-09-08 ENCOUNTER — Encounter: Payer: Self-pay | Admitting: Internal Medicine

## 2019-09-09 ENCOUNTER — Encounter: Payer: Self-pay | Admitting: Internal Medicine

## 2019-09-09 ENCOUNTER — Ambulatory Visit: Payer: Medicare Other | Admitting: Certified Registered Nurse Anesthetist

## 2019-09-09 ENCOUNTER — Encounter
Admission: RE | Disposition: A | Discharge status: Home / Self Care | Payer: Self-pay | Source: Home / Self Care | Attending: Internal Medicine

## 2019-09-09 ENCOUNTER — Ambulatory Visit
Admission: RE | Admit: 2019-09-09 | Discharge: 2019-09-09 | Disposition: A | Discharge status: Home / Self Care | Payer: Medicare Other | Attending: Internal Medicine | Admitting: Internal Medicine

## 2019-09-09 ENCOUNTER — Other Ambulatory Visit: Payer: Self-pay

## 2019-09-09 DIAGNOSIS — D123 Benign neoplasm of transverse colon: Secondary | ICD-10-CM | POA: Insufficient documentation

## 2019-09-09 DIAGNOSIS — Z79899 Other long term (current) drug therapy: Secondary | ICD-10-CM | POA: Insufficient documentation

## 2019-09-09 DIAGNOSIS — E119 Type 2 diabetes mellitus without complications: Secondary | ICD-10-CM | POA: Diagnosis not present

## 2019-09-09 DIAGNOSIS — R569 Unspecified convulsions: Secondary | ICD-10-CM | POA: Diagnosis not present

## 2019-09-09 DIAGNOSIS — D649 Anemia, unspecified: Secondary | ICD-10-CM | POA: Diagnosis not present

## 2019-09-09 DIAGNOSIS — I6930 Unspecified sequelae of cerebral infarction: Secondary | ICD-10-CM | POA: Insufficient documentation

## 2019-09-09 DIAGNOSIS — M1990 Unspecified osteoarthritis, unspecified site: Secondary | ICD-10-CM | POA: Diagnosis not present

## 2019-09-09 DIAGNOSIS — K2100 Gastro-esophageal reflux disease with esophagitis, without bleeding: Secondary | ICD-10-CM | POA: Insufficient documentation

## 2019-09-09 DIAGNOSIS — Z8719 Personal history of other diseases of the digestive system: Secondary | ICD-10-CM | POA: Insufficient documentation

## 2019-09-09 DIAGNOSIS — K222 Esophageal obstruction: Secondary | ICD-10-CM | POA: Insufficient documentation

## 2019-09-09 DIAGNOSIS — R5382 Chronic fatigue, unspecified: Secondary | ICD-10-CM | POA: Insufficient documentation

## 2019-09-09 DIAGNOSIS — B3781 Candidal esophagitis: Secondary | ICD-10-CM | POA: Diagnosis not present

## 2019-09-09 DIAGNOSIS — Z7902 Long term (current) use of antithrombotics/antiplatelets: Secondary | ICD-10-CM | POA: Insufficient documentation

## 2019-09-09 DIAGNOSIS — Z888 Allergy status to other drugs, medicaments and biological substances status: Secondary | ICD-10-CM | POA: Diagnosis not present

## 2019-09-09 DIAGNOSIS — Z7982 Long term (current) use of aspirin: Secondary | ICD-10-CM | POA: Diagnosis not present

## 2019-09-09 DIAGNOSIS — Z86010 Personal history of colonic polyps: Secondary | ICD-10-CM | POA: Insufficient documentation

## 2019-09-09 DIAGNOSIS — Z794 Long term (current) use of insulin: Secondary | ICD-10-CM | POA: Insufficient documentation

## 2019-09-09 DIAGNOSIS — Z1211 Encounter for screening for malignant neoplasm of colon: Secondary | ICD-10-CM | POA: Insufficient documentation

## 2019-09-09 DIAGNOSIS — I10 Essential (primary) hypertension: Secondary | ICD-10-CM | POA: Diagnosis not present

## 2019-09-09 DIAGNOSIS — M5030 Other cervical disc degeneration, unspecified cervical region: Secondary | ICD-10-CM | POA: Diagnosis not present

## 2019-09-09 DIAGNOSIS — K640 First degree hemorrhoids: Secondary | ICD-10-CM | POA: Insufficient documentation

## 2019-09-09 DIAGNOSIS — E785 Hyperlipidemia, unspecified: Secondary | ICD-10-CM | POA: Diagnosis not present

## 2019-09-09 DIAGNOSIS — K449 Diaphragmatic hernia without obstruction or gangrene: Secondary | ICD-10-CM | POA: Diagnosis not present

## 2019-09-09 HISTORY — DX: Personal history of peptic ulcer disease: Z87.11

## 2019-09-09 HISTORY — DX: Barrett's esophagus without dysplasia: K22.70

## 2019-09-09 HISTORY — PX: ESOPHAGOGASTRODUODENOSCOPY (EGD) WITH PROPOFOL: SHX5813

## 2019-09-09 HISTORY — PX: COLONOSCOPY WITH PROPOFOL: SHX5780

## 2019-09-09 HISTORY — DX: Hemiplegia and hemiparesis following cerebral infarction affecting unspecified side: I69.359

## 2019-09-09 LAB — KOH PREP

## 2019-09-09 LAB — GLUCOSE, CAPILLARY: Glucose-Capillary: 286 mg/dL — ABNORMAL HIGH (ref 70–99)

## 2019-09-09 SURGERY — COLONOSCOPY WITH PROPOFOL
Anesthesia: General

## 2019-09-09 MED ORDER — LIDOCAINE HCL (CARDIAC) PF 100 MG/5ML IV SOSY
PREFILLED_SYRINGE | INTRAVENOUS | Status: DC | PRN
  Administered 2019-09-09: 50 mg via INTRAVENOUS

## 2019-09-09 MED ORDER — PROPOFOL 500 MG/50ML IV EMUL
INTRAVENOUS | Status: DC | PRN
  Administered 2019-09-09: 135 ug/kg/min via INTRAVENOUS

## 2019-09-09 MED ORDER — LIDOCAINE HCL (PF) 2 % IJ SOLN
INTRAMUSCULAR | Status: AC
  Filled 2019-09-09: qty 5

## 2019-09-09 MED ORDER — SODIUM CHLORIDE 0.9 % IV SOLN
INTRAVENOUS | Status: DC
  Administered 2019-09-09: 20 mL/h via INTRAVENOUS

## 2019-09-09 MED ORDER — PROPOFOL 10 MG/ML IV BOLUS
INTRAVENOUS | Status: DC | PRN
  Administered 2019-09-09: 80 mg via INTRAVENOUS

## 2019-09-09 MED ORDER — PROPOFOL 500 MG/50ML IV EMUL
INTRAVENOUS | Status: AC
  Filled 2019-09-09: qty 50

## 2019-09-09 NOTE — Interval H&P Note (Signed)
History and Physical Interval Note:  09/09/2019 9:04 AM  Emily Barber  has presented today for surgery, with the diagnosis of [place].  The various methods of treatment have been discussed with the patient and family. After consideration of risks, benefits and other options for treatment, the patient has consented to  Procedure(s): COLONOSCOPY WITH PROPOFOL (N/A) ESOPHAGOGASTRODUODENOSCOPY (EGD) WITH PROPOFOL (N/A) as a surgical intervention.  The patient's history has been reviewed, patient examined, no change in status, stable for surgery.  I have reviewed the patient's chart and labs.  Questions were answered to the patient's satisfaction.     [place], [place]

## 2019-09-09 NOTE — Op Note (Signed)
[facility] Gastroenterology Patient Name: Emily Barber Procedure Date: 09/09/2019 8:33 AM MRN: [id number] Account #: [id number] Date of Birth: 05-14-1950 Admit Type: Outpatient Age: 69 Room: [facility] ENDO ROOM 3 Gender: Female Note Status: Finalized Procedure:             Colonoscopy Indications:           High risk colon cancer surveillance: Personal history                         of colonic polyps Providers:             Benay Pike. Kambrey Hagger MD, MD Medicines:             Propofol per Anesthesia Complications:         No immediate complications. Procedure:             Pre-Anesthesia Assessment:                        - The risks and benefits of the procedure and the                         sedation options and risks were discussed with the                         patient. All questions were answered and informed                         consent was obtained.                        - Patient identification and proposed procedure were                         verified prior to the procedure by the nurse. The                         procedure was verified in the procedure room.                        - ASA Grade Assessment: III - A patient with severe                         systemic disease.                        - After reviewing the risks and benefits, the patient                         was deemed in satisfactory condition to undergo the                         procedure.                        After obtaining informed consent, the colonoscope was                         passed under direct vision. Throughout the procedure,  the patient's blood pressure, pulse, and oxygen                         saturations were monitored continuously. The                         Colonoscope was introduced through the anus and                         advanced to the the cecum, identified by appendiceal                         orifice and ileocecal valve.  The colonoscopy was                         performed without difficulty. The patient tolerated                         the procedure well. The quality of the bowel                         preparation was good. The ileocecal valve, appendiceal                         orifice, and rectum were photographed. Findings:      The perianal and digital rectal examinations were normal. Pertinent       negatives include normal sphincter tone and no palpable rectal lesions.      Two sessile polyps were found in the transverse colon. The polyps were 3       to 4 mm in size. These polyps were removed with a jumbo cold forceps.       Resection and retrieval were complete.      Non-bleeding internal hemorrhoids were found during retroflexion. The       hemorrhoids were Grade I (internal hemorrhoids that do not prolapse).      The exam was otherwise without abnormality. Impression:            - Two 3 to 4 mm polyps in the transverse colon,                         removed with a jumbo cold forceps. Resected and                         retrieved.                        - Non-bleeding internal hemorrhoids.                        - The examination was otherwise normal. Recommendation:        - Await pathology results from EGD, also performed                         today.                        - Patient has a contact number available for  emergencies. The signs and symptoms of potential                         delayed complications were discussed with the patient.                         Return to normal activities tomorrow. Written                         discharge instructions were provided to the patient.                        - Resume previous diet.                        - Continue present medications.                        - Repeat colonoscopy is recommended for surveillance.                         The colonoscopy date will be determined after                          pathology results from today's exam become available                         for review.                        - Monitor results to esophageal dilation                        - Return to GI office in 3 months.                        - The findings and recommendations were discussed with                         the patient. Procedure Code(s):     --- Professional ---                        [phone], Colonoscopy, flexible; with biopsy, single or                         multiple Diagnosis Code(s):     --- Professional ---                        K64.0, First degree hemorrhoids                        K63.5, Polyp of colon                        Z86.010, Personal history of colonic polyps CPT copyright 2019 American Medical Association. All rights reserved. The codes documented in this report are preliminary and upon coder review may  be revised to meet current compliance requirements. Efrain Sella MD, MD 09/09/2019 10:00:23 AM This report has been signed electronically. Number of Addenda: 0 Note Initiated On: 09/09/2019 8:33 AM Scope  Withdrawal Time: 0 hours 6 minutes 11 seconds  Total Procedure Duration: 0 hours 10 minutes 45 seconds  Estimated Blood Loss:  Estimated blood loss: none.      [facility]

## 2019-09-09 NOTE — Transfer of Care (Signed)
Immediate Anesthesia Transfer of Care Note  Patient: Emily Barber  Procedure(s) Performed: COLONOSCOPY WITH PROPOFOL (N/A ) ESOPHAGOGASTRODUODENOSCOPY (EGD) WITH PROPOFOL (N/A )  Patient Location: PACU and Endoscopy Unit  Anesthesia Type:General  Level of Consciousness: drowsy  Airway & Oxygen Therapy: Patient Spontanous Breathing  Post-op Assessment: Report given to RN and Post -op Vital signs reviewed and stable  Post vital signs: Reviewed and stable  Last Vitals:  Vitals Value Taken Time  BP 92/46 09/09/19 0957  Temp    Pulse 101 09/09/19 0957  Resp 22 09/09/19 0957  SpO2 92 % 09/09/19 0957  Vitals shown include unvalidated device data.  Last Pain:  Vitals:   09/09/19 0847  TempSrc:   PainSc: 0-No pain         Complications: No complications documented.

## 2019-09-09 NOTE — Anesthesia Postprocedure Evaluation (Signed)
Anesthesia Post Note  Patient: Emily Barber  Procedure(s) Performed: COLONOSCOPY WITH PROPOFOL (N/A ) ESOPHAGOGASTRODUODENOSCOPY (EGD) WITH PROPOFOL (N/A )  Patient location during evaluation: Endoscopy Anesthesia Type: General Level of consciousness: awake and alert Pain management: pain level controlled Vital Signs Assessment: post-procedure vital signs reviewed and stable Respiratory status: spontaneous breathing, nonlabored ventilation, respiratory function stable and patient connected to nasal cannula oxygen Cardiovascular status: blood pressure returned to baseline and stable Postop Assessment: no apparent nausea or vomiting Anesthetic complications: no   No complications documented.   Last Vitals:  Vitals:   09/09/19 1006 09/09/19 1026  BP:  (!) 152/80  Pulse:    Resp: (!) 21   Temp:    SpO2:      Last Pain:  Vitals:   09/09/19 1026  TempSrc:   PainSc: 0-No pain                 Precious Haws Tazaria Dlugosz

## 2019-09-09 NOTE — Anesthesia Preprocedure Evaluation (Addendum)
Anesthesia Evaluation  Patient identified by MRN, date of birth, ID band Patient awake    Reviewed: Allergy & Precautions, H&P , NPO status , Patient's Chart, lab work & pertinent test results  History of Anesthesia Complications Negative for: history of anesthetic complications  Airway Mallampati: III  TM Distance: <3 FB Neck ROM: limited    Dental  (+) Chipped, Poor Dentition, Missing, Implants   Pulmonary neg pulmonary ROS, neg shortness of breath,    Pulmonary exam normal        Cardiovascular Exercise Tolerance: Good hypertension, Pt. on medications and Pt. on home beta blockers (-) angina(-) CAD, (-) Past MI, (-) Cardiac Stents and (-) CABG Normal cardiovascular exam(-) dysrhythmias (-) Valvular Problems/Murmurs     Neuro/Psych Seizures - (only with hypoglycemia),  CVA (left sided), Residual Symptoms negative psych ROS   GI/Hepatic Neg liver ROS, GERD  Medicated,  Endo/Other  diabetes, Type 2, Oral Hypoglycemic Agents  Renal/GU Renal disease (Pyelonephritis)  negative genitourinary   Musculoskeletal  (+) Arthritis , Osteoarthritis,    Abdominal   Peds  Hematology  (+) Blood dyscrasia, anemia ,   Anesthesia Other Findings Past Medical History: No date: Anemia No date: Chronic fatigue No date: DDD (degenerative disc disease), cervical No date: Diabetes mellitus without complication (HCC) No date: Dysphagia No date: Hyperlipidemia No date: Hypertension No date: Pyelonephritis No date: Seizures (HCC) No date: Stroke ([place])   Reproductive/Obstetrics negative OB ROS                            Anesthesia Physical  Anesthesia Plan  ASA: III  Anesthesia Plan: General   Post-op Pain Management:    Induction: Intravenous  PONV Risk Score and Plan: 2 and Propofol infusion and TIVA  Airway Management Planned: Nasal Cannula  Additional Equipment:   Intra-op Plan:    Post-operative Plan:   Informed Consent: I have reviewed the patients History and Physical, chart, labs and discussed the procedure including the risks, benefits and alternatives for the proposed anesthesia with the patient or authorized representative who has indicated his/her understanding and acceptance.     Dental Advisory Given  Plan Discussed with: Anesthesiologist, CRNA and Surgeon  Anesthesia Plan Comments: (History and consent via phone interpreter   Patient consented for risks of anesthesia including but not limited to:  - adverse reactions to medications - risk of intubation if required - damage to eyes, teeth, lips or other oral mucosa - nerve damage due to positioning  - sore throat or hoarseness - Damage to heart, brain, nerves, lungs, other parts of body or loss of life  Patient voiced understanding.)        Anesthesia Quick Evaluation

## 2019-09-09 NOTE — OR Nursing (Signed)
Esophageal Brushing done to collect samples to be tested for Candida.  KOH cytology prep lab requisition sent to lab with specimen.

## 2019-09-09 NOTE — Op Note (Signed)
[facility] Gastroenterology Patient Name: Theone Bowell Procedure Date: 09/09/2019 8:33 AM MRN: [id number] Account #: [id number] Date of Birth: 1950/12/27 Admit Type: Outpatient Age: 69 Room: [facility] ENDO ROOM 3 Gender: Female Note Status: Finalized Procedure:             Upper GI endoscopy Indications:           Surveillance for malignancy due to personal history of                         Barrett's esophagus, Dysphagia, Esophageal reflux Providers:             Benay Pike. Tabria Steines MD, MD Medicines:             Propofol per Anesthesia Complications:         No immediate complications. Procedure:             Pre-Anesthesia Assessment:                        - The risks and benefits of the procedure and the                         sedation options and risks were discussed with the                         patient. All questions were answered and informed                         consent was obtained.                        - Patient identification and proposed procedure were                         verified prior to the procedure by the nurse. The                         procedure was verified in the procedure room.                        - ASA Grade Assessment: III - A patient with severe                         systemic disease.                        - After reviewing the risks and benefits, the patient                         was deemed in satisfactory condition to undergo the                         procedure.                        After obtaining informed consent, the endoscope was                         passed under direct vision. Throughout the procedure,  the patient's blood pressure, pulse, and oxygen                         saturations were monitored continuously. The Endoscope                         was introduced through the mouth, and advanced to the                         third part of duodenum. The upper GI endoscopy was                          accomplished without difficulty. The patient tolerated                         the procedure well. Findings:      Diffuse, white plaques were found in the middle third of the esophagus.       Cells for cytology were obtained by brushing.      The esophagus and gastroesophageal junction were examined with white       light. There was no visual evidence of Barrett's esophagus. Mucosa was       biopsied with a cold forceps for histology in 4 quadrants at the       gastroesophageal junction. One specimen bottle was sent to pathology.      One benign-appearing, intrinsic mild stenosis was found at the       gastroesophageal junction. This stenosis measured 1.4 cm (inner       diameter) x less than one cm (in length). The stenosis was traversed.       The scope was withdrawn. Dilation was performed with a Maloney dilator       with no resistance at 2 Fr.      A 1 cm hiatal hernia was present.      The examined duodenum was normal.      The exam was otherwise without abnormality. Impression:            - Esophageal plaques were found, consistent with                         candidiasis. Cells for cytology obtained.                        - There is no endoscopic evidence of Barrett's                         esophagus. Biopsied.                        - Benign-appearing esophageal stenosis. Dilated.                        - 1 cm hiatal hernia.                        - Normal examined duodenum.                        - The examination was otherwise normal. Recommendation:        - Await pathology results.                        -  Monitor results to esophageal dilation                        - Proceed with colonoscopy Procedure Code(s):     --- Professional ---                        [phone], Esophagogastroduodenoscopy, flexible,                         transoral; with biopsy, single or multiple                        [id number], Dilation of esophagus, by unguided sound or                          bougie, single or multiple passes Diagnosis Code(s):     --- Professional ---                        K21.9, Gastro-esophageal reflux disease without                         esophagitis                        R13.10, Dysphagia, unspecified                        K44.9, Diaphragmatic hernia without obstruction or                         gangrene                        K22.2, Esophageal obstruction                        K22.70, Barrett's esophagus without dysplasia                        K22.9, Disease of esophagus, unspecified CPT copyright 2019 American Medical Association. All rights reserved. The codes documented in this report are preliminary and upon coder review may  be revised to meet current compliance requirements. Efrain Sella MD, MD 09/09/2019 9:41:02 AM This report has been signed electronically. Number of Addenda: 0 Note Initiated On: 09/09/2019 8:33 AM Estimated Blood Loss:  Estimated blood loss: none.      [facility]

## 2019-09-09 NOTE — H&P (Signed)
Outpatient short stay form Pre-procedure 09/09/2019 8:59 AM Armany Mano K. Alice Reichert, M.D.  Primary Physician: Fulton Reek, M.D.  Reason for visit:  Barrett's esophagus, Personal history of colon polyps  History of present illness:  69 y/o female with a history of Barrett's esophagus presents for surveillance.                           Patient presents for colonoscopy for a personal hx of colon polyps. The patient denies abdominal pain, abnormal weight loss or rectal bleeding.      Current Facility-Administered Medications:  .  0.9 %  sodium chloride infusion, , Intravenous, Continuous, [place], Benay Pike, MD, Last Rate: 20 mL/hr at 09/09/19 0858, 20 mL/hr at 09/09/19 0858  Medications Prior to Admission  Medication Sig Dispense Refill Last Dose  . amLODipine (NORVASC) 5 MG tablet Take 5 mg by mouth daily.   Past Week at Unknown time  . aspirin EC 81 MG tablet Take 81 mg by mouth daily.   Past Week at Unknown time  . clopidogrel (PLAVIX) 75 MG tablet Take 75 mg by mouth daily.   Past Week at Unknown time  . Coenzyme Q10 100 MG capsule Take 100 mg by mouth daily.   Past Week at Unknown time  . ESTRACE VAGINAL 0.1 MG/GM vaginal cream APPLY ONE APPLICATORFUL VAGINALLY THREE TIMES A WEEK 43 g 0 Past Week at Unknown time  . ferrous sulfate 325 (65 FE) MG tablet Take 325 mg by mouth daily with breakfast.   Past Week at Unknown time  . glipiZIDE (GLUCOTROL) 10 MG tablet Take 10 mg by mouth daily before breakfast.   Past Week at Unknown time  . hydrochlorothiazide (HYDRODIURIL) 25 MG tablet Take 25 mg by mouth daily.   Past Week at Unknown time  . Insulin Degludec (TRESIBA FLEXTOUCH) 200 UNIT/ML SOPN Inject 50 Units into the skin daily.   Past Week at Unknown time  . Insulin Glargine-Lixisenatide (SOLIQUA) 100-33 UNT-MCG/ML SOPN Inject 15-35 Units into the skin.   Past Week at Unknown time  . isosorbide mononitrate (IMDUR) 30 MG 24 hr tablet Take 30 mg by mouth 2 (two) times daily.   Past Week at  Unknown time  . losartan (COZAAR) 100 MG tablet Take 100 mg by mouth daily.   Past Week at Unknown time  . metFORMIN (GLUCOPHAGE) 1000 MG tablet Take 1,000 mg by mouth 2 (two) times daily with a meal.   Past Week at Unknown time  . metoprolol succinate (TOPROL-XL) 100 MG 24 hr tablet Take 100 mg by mouth daily. Take with or immediately following a meal.   Past Week at Unknown time  . MULTIPLE VITAMIN PO Take 1 tablet by mouth daily.   Past Week at Unknown time  . multivitamin-lutein (OCUVITE-LUTEIN) CAPS capsule Take 1 capsule by mouth daily.   Past Week at Unknown time  . omega-3 acid ethyl esters (LOVAZA) 1 g capsule Take 1 g by mouth 2 (two) times daily.   Past Week at Unknown time  . pantoprazole (PROTONIX) 40 MG tablet Take 40 mg by mouth daily.   Past Week at Unknown time  . pioglitazone (ACTOS) 15 MG tablet Take 15 mg by mouth daily.   Past Week at Unknown time  . potassium chloride SA (K-DUR,KLOR-CON) 20 MEQ tablet Take 20 mEq by mouth daily.   Past Week at Unknown time  . simvastatin (ZOCOR) 20 MG tablet Take 20 mg by mouth daily.  Past Week at Unknown time  . valsartan (DIOVAN) 160 MG tablet Take 160 mg by mouth daily.   Past Week at Unknown time     Allergies  Allergen Reactions  . Povidone Iodine      Past Medical History:  Diagnosis Date  . Anemia   . Barrett's esophagus   . Chronic fatigue   . DDD (degenerative disc disease), cervical   . Diabetes mellitus without complication ([place])   . Dysphagia   . Hemiparesis affecting dominant side as late effect of stroke ([place])   . History of gastric ulcer   . Hyperlipidemia   . Hypertension   . Pyelonephritis   . Seizures ([place])   . Stroke [facility])     Review of systems:  Otherwise negative.    Physical Exam  Gen: Alert, oriented. Appears stated age.  HEENT: [place]/AT. PERRLA. Lungs: CTA, no wheezes. CV: RR nl S1, S2. Abd: soft, benign, no masses. BS+ Ext: No edema. Pulses 2+    Planned procedures: Proceed with EGD and  colonoscopy. The patient understands the nature of the planned procedure, indications, risks, alternatives and potential complications including but not limited to bleeding, infection, perforation, damage to internal organs and possible oversedation/side effects from anesthesia. The patient agrees and gives consent to proceed.  Please refer to procedure notes for findings, recommendations and patient disposition/instructions.     Ramces Shomaker K. Alice Reichert, M.D. Gastroenterology 09/09/2019  8:59 AM

## 2019-09-10 ENCOUNTER — Encounter: Payer: Self-pay | Admitting: Internal Medicine

## 2019-09-10 LAB — SURGICAL PATHOLOGY

## 2020-01-06 ENCOUNTER — Other Ambulatory Visit: Payer: Self-pay | Admitting: Internal Medicine

## 2020-01-06 DIAGNOSIS — Z1231 Encounter for screening mammogram for malignant neoplasm of breast: Secondary | ICD-10-CM

## 2020-02-12 ENCOUNTER — Ambulatory Visit
Admission: RE | Admit: 2020-02-12 | Discharge: 2020-02-12 | Disposition: A | Discharge status: Home / Self Care | Payer: Medicare Other | Source: Ambulatory Visit | Attending: Internal Medicine | Admitting: Internal Medicine

## 2020-02-12 ENCOUNTER — Other Ambulatory Visit: Payer: Self-pay

## 2020-02-12 DIAGNOSIS — Z1231 Encounter for screening mammogram for malignant neoplasm of breast: Secondary | ICD-10-CM | POA: Diagnosis not present

## 2021-01-06 ENCOUNTER — Other Ambulatory Visit: Payer: Self-pay | Admitting: Internal Medicine

## 2021-01-06 DIAGNOSIS — Z1231 Encounter for screening mammogram for malignant neoplasm of breast: Secondary | ICD-10-CM

## 2021-02-13 ENCOUNTER — Other Ambulatory Visit: Payer: Self-pay

## 2021-02-13 ENCOUNTER — Ambulatory Visit
Admission: RE | Admit: 2021-02-13 | Discharge: 2021-02-13 | Disposition: A | Discharge status: Home / Self Care | Payer: Medicare Other | Source: Ambulatory Visit | Attending: Internal Medicine | Admitting: Internal Medicine

## 2021-02-13 DIAGNOSIS — Z1231 Encounter for screening mammogram for malignant neoplasm of breast: Secondary | ICD-10-CM | POA: Diagnosis present

## 2021-02-27 ENCOUNTER — Other Ambulatory Visit: Payer: Self-pay | Admitting: Internal Medicine

## 2021-02-27 ENCOUNTER — Other Ambulatory Visit (HOSPITAL_COMMUNITY): Payer: Self-pay | Admitting: Internal Medicine

## 2021-02-27 DIAGNOSIS — Z0000 Encounter for general adult medical examination without abnormal findings: Secondary | ICD-10-CM

## 2021-02-27 DIAGNOSIS — R55 Syncope and collapse: Secondary | ICD-10-CM

## 2022-01-10 ENCOUNTER — Other Ambulatory Visit: Payer: Self-pay | Admitting: Internal Medicine

## 2022-01-10 DIAGNOSIS — Z1231 Encounter for screening mammogram for malignant neoplasm of breast: Secondary | ICD-10-CM

## 2022-02-20 ENCOUNTER — Other Ambulatory Visit: Payer: Self-pay | Admitting: Nephrology

## 2022-02-20 DIAGNOSIS — R8290 Unspecified abnormal findings in urine: Secondary | ICD-10-CM

## 2022-02-20 DIAGNOSIS — R801 Persistent proteinuria, unspecified: Secondary | ICD-10-CM

## 2022-02-20 DIAGNOSIS — N184 Chronic kidney disease, stage 4 (severe): Secondary | ICD-10-CM

## 2022-02-20 DIAGNOSIS — E1122 Type 2 diabetes mellitus with diabetic chronic kidney disease: Secondary | ICD-10-CM

## 2022-02-21 ENCOUNTER — Ambulatory Visit
Admission: RE | Admit: 2022-02-21 | Discharge: 2022-02-21 | Disposition: A | Discharge status: Home / Self Care | Payer: 59 | Source: Ambulatory Visit | Attending: Internal Medicine | Admitting: Internal Medicine

## 2022-02-21 DIAGNOSIS — Z1231 Encounter for screening mammogram for malignant neoplasm of breast: Secondary | ICD-10-CM | POA: Diagnosis not present

## 2022-03-08 ENCOUNTER — Ambulatory Visit
Admission: RE | Admit: 2022-03-08 | Discharge: 2022-03-08 | Disposition: A | Discharge status: Home / Self Care | Payer: 59 | Source: Ambulatory Visit | Attending: Nephrology | Admitting: Nephrology

## 2022-03-08 DIAGNOSIS — R8290 Unspecified abnormal findings in urine: Secondary | ICD-10-CM | POA: Diagnosis present

## 2022-03-08 DIAGNOSIS — R801 Persistent proteinuria, unspecified: Secondary | ICD-10-CM | POA: Insufficient documentation

## 2022-03-08 DIAGNOSIS — E1122 Type 2 diabetes mellitus with diabetic chronic kidney disease: Secondary | ICD-10-CM | POA: Diagnosis present

## 2022-03-08 DIAGNOSIS — N184 Chronic kidney disease, stage 4 (severe): Secondary | ICD-10-CM | POA: Diagnosis present

## 2022-07-14 ENCOUNTER — Ambulatory Visit (INDEPENDENT_AMBULATORY_CARE_PROVIDER_SITE_OTHER): Payer: 59

## 2022-07-14 ENCOUNTER — Ambulatory Visit
Admission: EM | Admit: 2022-07-14 | Discharge: 2022-07-14 | Disposition: A | Discharge status: Home / Self Care | Payer: 59 | Attending: Urgent Care | Admitting: Urgent Care

## 2022-07-14 ENCOUNTER — Encounter: Payer: Self-pay | Admitting: Emergency Medicine

## 2022-07-14 DIAGNOSIS — S93409A Sprain of unspecified ligament of unspecified ankle, initial encounter: Secondary | ICD-10-CM

## 2022-07-14 DIAGNOSIS — M25572 Pain in left ankle and joints of left foot: Secondary | ICD-10-CM

## 2022-07-14 DIAGNOSIS — W108XXA Fall (on) (from) other stairs and steps, initial encounter: Secondary | ICD-10-CM

## 2022-07-14 DIAGNOSIS — M25571 Pain in right ankle and joints of right foot: Secondary | ICD-10-CM

## 2022-07-14 DIAGNOSIS — R600 Localized edema: Secondary | ICD-10-CM | POA: Diagnosis not present

## 2022-07-14 NOTE — ED Notes (Signed)
Provider applied bilateral ace wraps and education to patient and family

## 2022-07-14 NOTE — Discharge Instructions (Signed)
Apply Ace wrap in the morning daily until compression stockings are available.  Remove at bedtime.  Recommend you purchase compression stockings that go all the way from the toes to the calves.  Start with a very large size and switch to a smaller size as the swelling resolves.  Apply the stockings in the morning and remove at bedtime.  Follow-up with your primary care or kidney providers.

## 2022-07-14 NOTE — ED Provider Notes (Signed)
[name]    CSN: [id number] Arrival date & time: 07/14/22  0909      History   Chief Complaint No chief complaint on file.   HPI Emily Barber is a 72 y.o. female.   HPI  Accompanied by her son.  Patient and family member declined interpreter service.  Presents to urgent care after trip and fall while ascending stairs 6 days ago.  She reports new swelling and tenderness in her ankles.  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   Past Surgical History:  Procedure Laterality Date   CESAREAN SECTION     COLONOSCOPY N/A 10/05/2015   Procedure: COLONOSCOPY;  Surgeon: Scot Jun, MD;  Location: [facility] ENDOSCOPY;  Service: Endoscopy;  Laterality: N/A;   COLONOSCOPY WITH PROPOFOL N/A 09/09/2019   Procedure: COLONOSCOPY WITH PROPOFOL;  Surgeon: Toledo, Boykin Nearing, MD;  Location: [facility] ENDOSCOPY;  Service: Gastroenterology;  Laterality: N/A;   ESOPHAGOGASTRODUODENOSCOPY (EGD) WITH PROPOFOL N/A 09/07/2015   Procedure: ESOPHAGOGASTRODUODENOSCOPY (EGD) WITH PROPOFOL;  Surgeon: Scot Jun, MD;  Location: [facility] ENDOSCOPY;  Service: Endoscopy;  Laterality: N/A;   ESOPHAGOGASTRODUODENOSCOPY (EGD) WITH PROPOFOL N/A 10/02/2017   Procedure: ESOPHAGOGASTRODUODENOSCOPY (EGD) WITH PROPOFOL;  Surgeon: Scot Jun, MD;  Location: [facility] ENDOSCOPY;  Service: Endoscopy;  Laterality: N/A;   ESOPHAGOGASTRODUODENOSCOPY (EGD) WITH PROPOFOL N/A 09/09/2019    Procedure: ESOPHAGOGASTRODUODENOSCOPY (EGD) WITH PROPOFOL;  Surgeon: Toledo, Boykin Nearing, MD;  Location: [facility] ENDOSCOPY;  Service: Gastroenterology;  Laterality: N/A;    OB History   No obstetric history on file.      Home Medications    Prior to Admission medications   Medication Sig Start Date End Date Taking? Authorizing Provider  amLODipine (NORVASC) 5 MG tablet Take 5 mg by mouth daily.    [provider]  aspirin EC 81 MG tablet Take 81 mg by mouth daily.    [provider]  clopidogrel (PLAVIX) 75 MG tablet Take 75 mg by mouth daily.    [provider]  Coenzyme Q10 100 MG capsule Take 100 mg by mouth daily.    [provider]  ESTRACE VAGINAL 0.1 MG/GM vaginal cream APPLY ONE APPLICATORFUL VAGINALLY THREE TIMES A WEEK 01/11/15   Herring, Vear Clock, NP  ferrous sulfate 325 (65 FE) MG tablet Take 325 mg by mouth daily with breakfast.    [provider]  glipiZIDE (GLUCOTROL) 10 MG tablet Take 10 mg by mouth daily before breakfast.    [provider]  hydrochlorothiazide (HYDRODIURIL) 25 MG tablet Take 25 mg by mouth daily.    [provider]  Insulin Degludec (TRESIBA FLEXTOUCH) 200 UNIT/ML SOPN Inject 50 Units into the skin daily.    [provider]  Insulin Glargine-Lixisenatide (SOLIQUA) 100-33 UNT-MCG/ML SOPN Inject 15-35 Units into the skin.    [provider]  isosorbide mononitrate (IMDUR) 30 MG  24 hr tablet Take 30 mg by mouth 2 (two) times daily.    [provider]  losartan (COZAAR) 100 MG tablet Take 100 mg by mouth daily.    [provider]  metFORMIN (GLUCOPHAGE) 1000 MG tablet Take 1,000 mg by mouth 2 (two) times daily with a meal.    [provider]  metoprolol succinate (TOPROL-XL) 100 MG 24 hr tablet Take 100 mg by mouth daily. Take with or immediately following a meal.    [provider]  MULTIPLE VITAMIN PO Take 1 tablet by mouth daily.    [provider]  multivitamin-lutein (OCUVITE-LUTEIN) CAPS capsule Take 1 capsule by mouth daily.    [provider]  omega-3 acid ethyl esters (LOVAZA) 1 g capsule Take 1 g by mouth 2 (two) times daily.    [provider]  pantoprazole (PROTONIX) 40 MG tablet Take 40 mg by mouth daily.    [provider]  pioglitazone (ACTOS) 15 MG tablet Take 15 mg by mouth daily.    [provider]  potassium chloride SA (K-DUR,KLOR-CON) 20 MEQ tablet Take 20 mEq by mouth daily.    [provider]  simvastatin (ZOCOR) 20 MG tablet Take 20 mg by mouth daily.    [provider]  valsartan (DIOVAN) 160 MG tablet Take 160 mg by mouth daily.    [provider]    Family History Family History  Problem Relation Age of Onset   Breast cancer Maternal Grandmother     Social History Social History   Tobacco Use   Smoking status: Never   Smokeless tobacco: Never  Substance Use Topics   Alcohol use: No   Drug use: No     Allergies   Povidone iodine   Review of Systems Review of Systems   Physical Exam Triage Vital Signs ED Triage Vitals  Enc Vitals Group     BP 07/14/22 0951 (!) 158/76     Pulse Rate 07/14/22 0951 88     Resp 07/14/22 0951 16     Temp 07/14/22 0951 99.3 F (37.4 C)     Temp Source 07/14/22 0951 Oral     SpO2 07/14/22 0951 93 %     Weight --      Height --      Head Circumference --      Peak Flow --      Pain Score 07/14/22 0953 6     Pain Loc --      Pain Edu? --      Excl. in GC? --    No data found.  Updated Vital Signs BP (!) 158/76 (BP Location: Left Arm)   Pulse 88   Temp 99.3 F (37.4 C) (Oral)   Resp 16   SpO2 93%   Visual Acuity Right Eye Distance:   Left Eye Distance:   Bilateral Distance:    Right Eye Near:   Left Eye Near:    Bilateral Near:     Physical Exam Musculoskeletal:     Right ankle: Tenderness present over the lateral malleolus, medial malleolus and base of 5th  metatarsal. No proximal fibula tenderness. Normal range of motion.     Left ankle: Tenderness present over the lateral malleolus, medial malleolus and base of 5th metatarsal. No proximal fibula tenderness. Normal range of motion.       Feet:      UC Treatments / Results  Labs (all labs ordered are listed, but only abnormal results  are displayed) Labs Reviewed - No data to display  EKG   Radiology No results found.  Procedures Procedures (including critical care time)  Medications Ordered in UC Medications - No data to display  Initial Impression / Assessment and Plan / UC Course  I have reviewed the triage vital signs and the nursing notes.  Pertinent labs & imaging results that were available during my care of the patient were reviewed by me and considered in my medical decision making (see chart for details).   Emily Barber is a 72 y.o. female presenting with BLE edema and ankle pain following a fall. Patient is afebrile without recent antipyretics, satting well on room air. Overall is well appearing, well hydrated, without respiratory distress. Pulmonary exam is unremarkable.  Lungs CTAB without wheezing, rhonchi, rales. RRR without murmurs, rubs, gallops.  Unaltered gait at baseline.  Reviewed relevant chart history. Additional history obtained from patient family/caregiver present during the exam.  No historic evaluation in her chart for CHF but suspected based upon BLE edema and in the context of CKD S4 and poorly controlled DM2.  Bilateral ankle x-rays will be obtained given her mechanism of injury and reported ankle pain in history and on exam.  IMPRESSION: 1. No fracture or dislocation. 2. Soft tissue swelling over the medial and lateral malleolus.  Will apply ace wraps to her feet and ankles today.  Will instruct patient to remove at nighttime and reapply.  Recommending supportive care measures including compression stockings.  She can follow-up with her  primary care or renal provider regarding medical management of her BLE edema  Counseled patient on potential for adverse effects with medications prescribed/recommended today, ER and return-to-clinic precautions discussed, patient verbalized understanding and agreement with care plan.  Final Clinical Impressions(s) / UC Diagnoses   Final diagnoses:  None   Discharge Instructions   None    ED Prescriptions   None    PDMP not reviewed this encounter.   Charma Igo, [place] 07/14/22 1110

## 2022-07-14 NOTE — ED Triage Notes (Signed)
Larey Seat going up stairs in her house on Sunday this past week. Reports new swelling and tenderness in both ankles. Left appears slightly more bruised than right.

## 2022-10-15 ENCOUNTER — Encounter: Payer: Self-pay | Admitting: Internal Medicine

## 2022-10-15 ENCOUNTER — Other Ambulatory Visit: Payer: Self-pay | Admitting: Internal Medicine

## 2022-10-15 DIAGNOSIS — M7989 Other specified soft tissue disorders: Secondary | ICD-10-CM

## 2022-10-18 ENCOUNTER — Ambulatory Visit
Admission: RE | Admit: 2022-10-18 | Discharge: 2022-10-18 | Disposition: A | Discharge status: Home / Self Care | Payer: 59 | Source: Ambulatory Visit | Attending: Internal Medicine | Admitting: Internal Medicine

## 2022-10-18 ENCOUNTER — Inpatient Hospital Stay: Admission: RE | Admit: 2022-10-18 | Payer: 59 | Source: Ambulatory Visit

## 2022-10-18 DIAGNOSIS — M7989 Other specified soft tissue disorders: Secondary | ICD-10-CM

## 2022-10-24 ENCOUNTER — Other Ambulatory Visit: Payer: 59

## 2023-01-28 ENCOUNTER — Other Ambulatory Visit: Payer: Self-pay | Admitting: Internal Medicine

## 2023-01-28 DIAGNOSIS — Z1231 Encounter for screening mammogram for malignant neoplasm of breast: Secondary | ICD-10-CM

## 2023-02-27 ENCOUNTER — Ambulatory Visit
Admission: RE | Admit: 2023-02-27 | Discharge: 2023-02-27 | Disposition: A | Discharge status: Home / Self Care | Payer: 59 | Source: Ambulatory Visit | Attending: Internal Medicine | Admitting: Internal Medicine

## 2023-02-27 DIAGNOSIS — Z1231 Encounter for screening mammogram for malignant neoplasm of breast: Secondary | ICD-10-CM | POA: Diagnosis present

## 2023-04-22 ENCOUNTER — Other Ambulatory Visit: Payer: Self-pay | Admitting: Internal Medicine

## 2023-04-22 DIAGNOSIS — I10 Essential (primary) hypertension: Secondary | ICD-10-CM

## 2023-04-22 DIAGNOSIS — R55 Syncope and collapse: Secondary | ICD-10-CM

## 2023-05-18 ENCOUNTER — Encounter: Payer: Self-pay | Admitting: *Deleted

## 2023-05-18 ENCOUNTER — Ambulatory Visit
Admission: EM | Admit: 2023-05-18 | Discharge: 2023-05-18 | Disposition: A | Discharge status: Home / Self Care | Attending: Emergency Medicine | Admitting: Emergency Medicine

## 2023-05-18 DIAGNOSIS — B370 Candidal stomatitis: Secondary | ICD-10-CM

## 2023-05-18 MED ORDER — NYSTATIN 100000 UNIT/ML MT SUSP: 500000.0000 [IU] | Freq: Four times a day (QID) | OROMUCOSAL | 0 refills | Status: AC

## 2023-05-18 MED ORDER — LIDOCAINE VISCOUS HCL 2 % MT SOLN: 15.0000 mL | OROMUCOSAL | 0 refills | Status: AC | PRN

## 2023-05-18 NOTE — Discharge Instructions (Addendum)
?????????????????????????????????????????????????????????????????, ????????????????????????????????????????????    Gargle ????????? nystatin ???? 6 ??????????????? 7 ???  ????????????? ??? ????????????? lidocaine  ???? 4 ??????? ??????????????????????????, ?????????????????????????.  ???????????? Tylenol ???? 6 ????????????????????????????????????  ??????????????????????????????????????????????????????????????????????  ????????????????????????, ???????????????? ?? ????????????   Today on exam the presentation of the tongue is consistent with yeast, most likely related to your diabetes  Gargle and and swallow nystatin every 6 hours for 7 days  You may gargle and spit lidocaine  solution every 4 hours to help reduce pain, will temporarily numb the mouth  You may take Tylenol every 6 hours as needed for pain additionally  Increase your fluid intake until you are able to tolerate food as normal  If your symptoms continue to persist please follow-up with your primary doctor

## 2023-05-18 NOTE — ED Provider Notes (Addendum)
 Emily Barber    CSN: [id number] Arrival date & time: 05/18/23  4098      History   Chief Complaint No chief complaint on file.   HPI Emily Barber is a 73 y.o. female.   Patient presents for evaluation of mouth pain, sore throat and a thick Talin Feister coating to the tongue present for 3 days.  Difficulty tolerating food therefore decreased appetite, able to tolerate liquids.  Attempted salt water gargle but was ineffective.  Denies fever, congestion, cough.  No known sick contacts.  History of diabetes, uncontrolled.  Accompanied by daughter who completed interpretation, declined formal interpreter  Past Medical History:  Diagnosis Date   Anemia    Barrett's esophagus    Chronic fatigue    DDD (degenerative disc disease), cervical    Diabetes mellitus without complication (HCC)    Dysphagia    Hemiparesis affecting dominant side as late effect of stroke (HCC)    History of gastric ulcer    Hyperlipidemia    Hypertension    Pyelonephritis    Seizures (HCC)    Stroke (HCC)     There are no active problems to display for this patient.   Past Surgical History:  Procedure Laterality Date   CESAREAN SECTION     COLONOSCOPY N/A 10/05/2015   Procedure: COLONOSCOPY;  Surgeon: Cassie Click, MD;  Location: [facility] ENDOSCOPY;  Service: Endoscopy;  Laterality: N/A;   COLONOSCOPY WITH PROPOFOL  N/A 09/09/2019   Procedure: COLONOSCOPY WITH PROPOFOL ;  Surgeon: Toledo, Alphonsus Jeans, MD;  Location: [facility] ENDOSCOPY;  Service: Gastroenterology;  Laterality: N/A;   ESOPHAGOGASTRODUODENOSCOPY (EGD) WITH PROPOFOL  N/A 09/07/2015   Procedure: ESOPHAGOGASTRODUODENOSCOPY (EGD) WITH PROPOFOL ;  Surgeon: Cassie Click, MD;  Location: [facility] ENDOSCOPY;  Service: Endoscopy;  Laterality: N/A;   ESOPHAGOGASTRODUODENOSCOPY (EGD) WITH PROPOFOL  N/A 10/02/2017   Procedure: ESOPHAGOGASTRODUODENOSCOPY (EGD) WITH PROPOFOL ;  Surgeon: Cassie Click, MD;  Location: [facility] ENDOSCOPY;  Service: Endoscopy;   Laterality: N/A;   ESOPHAGOGASTRODUODENOSCOPY (EGD) WITH PROPOFOL  N/A 09/09/2019   Procedure: ESOPHAGOGASTRODUODENOSCOPY (EGD) WITH PROPOFOL ;  Surgeon: Toledo, Alphonsus Jeans, MD;  Location: [facility] ENDOSCOPY;  Service: Gastroenterology;  Laterality: N/A;    OB History   No obstetric history on file.      Home Medications    Prior to Admission medications   Medication Sig Start Date End Date Taking? Authorizing Provider  lidocaine  (XYLOCAINE ) 2 % solution Use as directed 15 mLs in the mouth or throat every 4 (four) hours as needed for mouth pain. 05/18/23  Yes Daphney Hopke R, NP  nystatin (MYCOSTATIN) 100000 UNIT/ML suspension Take 5 mLs (500,000 Units total) by mouth 4 (four) times daily. 05/18/23  Yes Fama Muenchow R, NP  amLODipine (NORVASC) 5 MG tablet Take 5 mg by mouth daily.    [provider]  aspirin EC 81 MG tablet Take 81 mg by mouth daily.    [provider]  clopidogrel (PLAVIX) 75 MG tablet Take 75 mg by mouth daily.    [provider]  Coenzyme Q10 100 MG capsule Take 100 mg by mouth daily.    [provider]  ESTRACE VAGINAL 0.1 MG/GM vaginal cream APPLY ONE APPLICATORFUL VAGINALLY THREE TIMES A WEEK 01/11/15   Herring, Verner Goltz, NP  ferrous sulfate 325 (65 FE) MG tablet Take 325 mg by mouth daily with breakfast.    [provider]  glipiZIDE (GLUCOTROL) 10 MG tablet Take 10 mg by mouth daily before breakfast.    [provider]  hydrochlorothiazide (HYDRODIURIL)  25 MG tablet Take 25 mg by mouth daily.    [provider]  Insulin Degludec (TRESIBA FLEXTOUCH) 200 UNIT/ML SOPN Inject 50 Units into the skin daily.    [provider]  Insulin Glargine-Lixisenatide (SOLIQUA) 100-33 UNT-MCG/ML SOPN Inject 15-35 Units into the skin.    [provider]  isosorbide mononitrate (IMDUR) 30 MG 24 hr tablet Take 30 mg by mouth 2 (two) times daily.    [provider]  losartan (COZAAR) 100 MG tablet Take  100 mg by mouth daily.    [provider]  metFORMIN (GLUCOPHAGE) 1000 MG tablet Take 1,000 mg by mouth 2 (two) times daily with a meal.    [provider]  metoprolol succinate (TOPROL-XL) 100 MG 24 hr tablet Take 100 mg by mouth daily. Take with or immediately following a meal.    [provider]  MULTIPLE VITAMIN PO Take 1 tablet by mouth daily.    [provider]  multivitamin-lutein (OCUVITE-LUTEIN) CAPS capsule Take 1 capsule by mouth daily.    [provider]  omega-3 acid ethyl esters (LOVAZA) 1 g capsule Take 1 g by mouth 2 (two) times daily.    [provider]  pantoprazole (PROTONIX) 40 MG tablet Take 40 mg by mouth daily.    [provider]  pioglitazone (ACTOS) 15 MG tablet Take 15 mg by mouth daily.    [provider]  potassium chloride SA (K-DUR,KLOR-CON) 20 MEQ tablet Take 20 mEq by mouth daily.    [provider]  simvastatin (ZOCOR) 20 MG tablet Take 20 mg by mouth daily.    [provider]  valsartan (DIOVAN) 160 MG tablet Take 160 mg by mouth daily.    [provider]    Family History Family History  Problem Relation Age of Onset   Breast cancer Maternal Grandmother     Social History Social History   Tobacco Use   Smoking status: Never   Smokeless tobacco: Never  Vaping Use   Vaping status: Never Used  Substance Use Topics   Alcohol use: No   Drug use: No     Allergies   Povidone iodine   Review of Systems Review of Systems   Physical Exam Triage Vital Signs ED Triage Vitals  Encounter Vitals Group     BP 05/18/23 0853 110/67     Systolic BP Percentile --      Diastolic BP Percentile --      Pulse Rate 05/18/23 0853 90     Resp 05/18/23 0853 20     Temp 05/18/23 0853 99.2 F (37.3 C)     Temp Source 05/18/23 0853 Oral     SpO2 05/18/23 0853 95 %     Weight 05/18/23 0848 170 lb (77.1 kg)     Height 05/18/23 0848 5\' 3"  (1.6 m)     Head  Circumference --      Peak Flow --      Pain Score 05/18/23 0847 6     Pain Loc --      Pain Education --      Exclude from Growth Chart --    No data found.  Updated Vital Signs BP 110/67 (BP Location: Left Arm)   Pulse 90   Temp 99.2 F (37.3 C) (Oral)   Resp 20   Ht 5\' 3"  (1.6 m)   Wt 170 lb (77.1 kg)   SpO2 95%   BMI 30.11 kg/m   Visual Acuity Right Eye  Distance:   Left Eye Distance:   Bilateral Distance:    Right Eye Near:   Left Eye Near:    Bilateral Near:     Physical Exam Constitutional:      Appearance: Normal appearance.  HENT:     Mouth/Throat:     Comments: Thick Verner Kopischke coating to the tongue, no bleeding noted, no abnormality to the pharynx Neurological:     Mental Status: She is alert and oriented to person, place, and time. Mental status is at baseline.      UC Treatments / Results  Labs (all labs ordered are listed, but only abnormal results are displayed) Labs Reviewed - No data to display  EKG   Radiology No results found.  Procedures Procedures (including critical care time)  Medications Ordered in UC Medications - No data to display  Initial Impression / Assessment and Plan / UC Course  I have reviewed the triage vital signs and the nursing notes.  Pertinent labs & imaging results that were available during my care of the patient were reviewed by me and considered in my medical decision making (see chart for details).  Thrush  Presentation consistent with above diagnosis, discussed with patient and family, likely related to diabetes, prescribed nystatin and viscous lidocaine , discussed administration, advised increase fluid intake until able to tolerate food at baseline, advised PCP follow-up if no improvement Final Clinical Impressions(s) / UC Diagnoses   Final diagnoses:  Thrush   Discharge Instructions      ?????????????????????????????????????????????????????????????????,  ????????????????????????????????????????????  Gargle ????????? nystatin ???? 6 ??????????????? 7 ???  ????????????? ??? ????????????? lidocaine  ???? 4 ??????? ??????????????????????????, ?????????????????????????.  ???????????? Tylenol ???? 6 ????????????????????????????????????  ??????????????????????????????????????????????????????????????????????  ????????????????????????, ???????????????? ?? ????????????   Today on exam the presentation of the tongue is consistent with yeast, most likely related to your diabetes  Gargle and and swallow nystatin every 6 hours for 7 days  You may gargle and spit lidocaine  solution every 4 hours to help reduce pain, will temporarily numb the mouth  You may take Tylenol every 6 hours as needed for pain additionally  Increase your fluid intake until you are able to tolerate food as normal  If your symptoms continue to persist please follow-up with your primary doctor  ED Prescriptions     Medication Sig Dispense Auth. Provider   nystatin (MYCOSTATIN) 100000 UNIT/ML suspension Take 5 mLs (500,000 Units total) by mouth 4 (four) times daily. 60 mL Delquan Poucher R, NP   lidocaine  (XYLOCAINE ) 2 % solution Use as directed 15 mLs in the mouth or throat every 4 (four) hours as needed for mouth pain. 100 mL Reena Canning, NP      PDMP not reviewed this encounter.   Reena Canning, NP 05/18/23 0917    Reena Canning, NP 05/18/23 [phone]

## 2023-05-18 NOTE — ED Triage Notes (Signed)
 Patient/daughter states 3 days of mouth pain and possible sores, thick white coating to tongue, unable to eat solids due to pain, feels tired

## 2024-02-07 ENCOUNTER — Other Ambulatory Visit: Payer: Self-pay | Admitting: Internal Medicine

## 2024-02-07 DIAGNOSIS — N1832 Chronic kidney disease, stage 3b: Secondary | ICD-10-CM

## 2024-02-12 ENCOUNTER — Ambulatory Visit: Admission: RE | Admit: 2024-02-12 | Discharge: 2024-02-12 | Attending: Internal Medicine | Admitting: Internal Medicine

## 2024-02-12 DIAGNOSIS — N1832 Chronic kidney disease, stage 3b: Secondary | ICD-10-CM
